# Patient Record
Sex: Female | Born: 1935 | Race: Black or African American | Hispanic: No | Marital: Single | Smoking: Never smoker
Health system: Southern US, Community
[De-identification: ages and names within clinical notes are randomized; demographics above are authoritative.]

## PROBLEM LIST (undated history)

## (undated) DIAGNOSIS — N189 Chronic kidney disease, unspecified: Secondary | ICD-10-CM

## (undated) HISTORY — PX: CHOLECYSTECTOMY: SHX55

---

## 2004-02-03 ENCOUNTER — Ambulatory Visit: Payer: Self-pay | Admitting: Internal Medicine

## 2010-06-13 ENCOUNTER — Ambulatory Visit: Payer: Self-pay | Admitting: Internal Medicine

## 2012-04-26 IMAGING — CR DG CHEST 2V
1 series · 3 of 3 positions shown · non-contrast
Comparison: none

REASON FOR EXAM: Shortness of breath
COMMENTS:

PROCEDURE:     DXR - DXR CHEST PA (OR AP) AND LATERAL  - June 13, 2010 [DATE]
RESULT:     The heart appears to be borderline enlarged. There is mild
hyperinflation. The interstitial markings are slightly prominent. No
significant pleural effusion is evident.

[Series 1: view not recorded · 0.17mm/px · 3 of 3 slices shown]
[im 1/3]
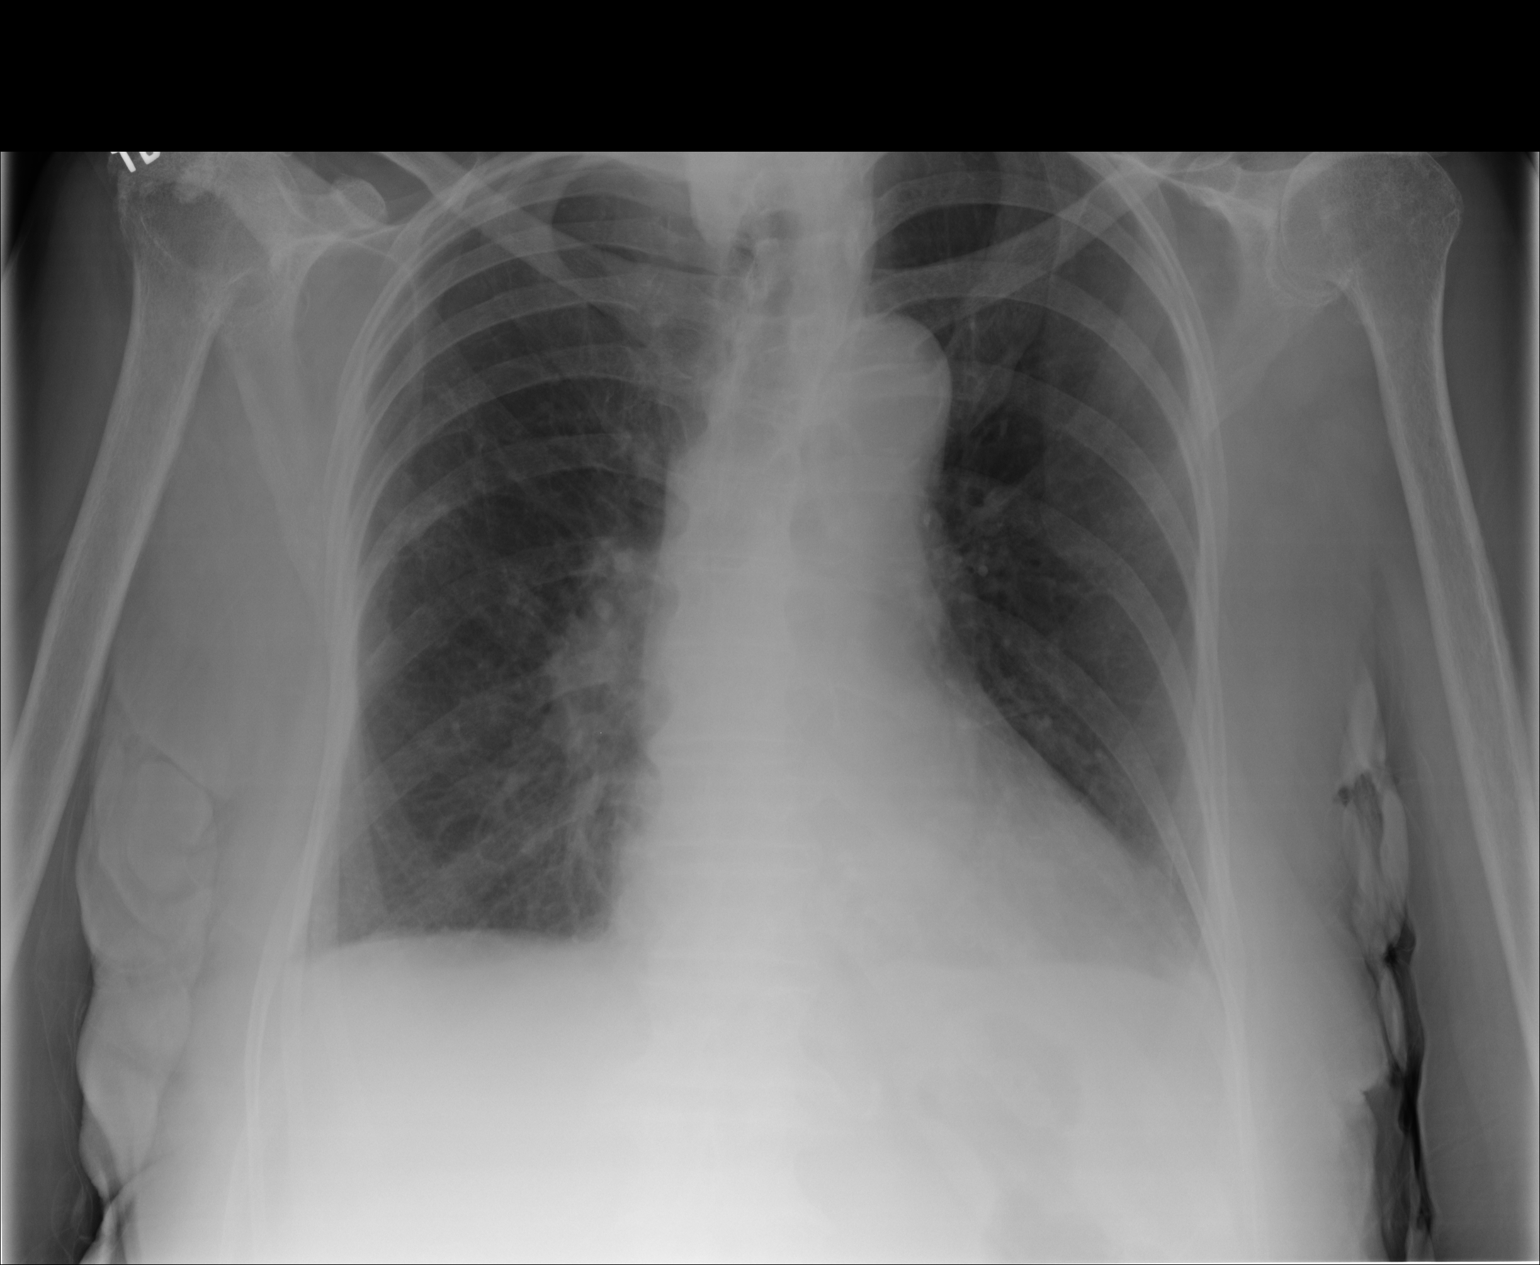
[im 2/3]
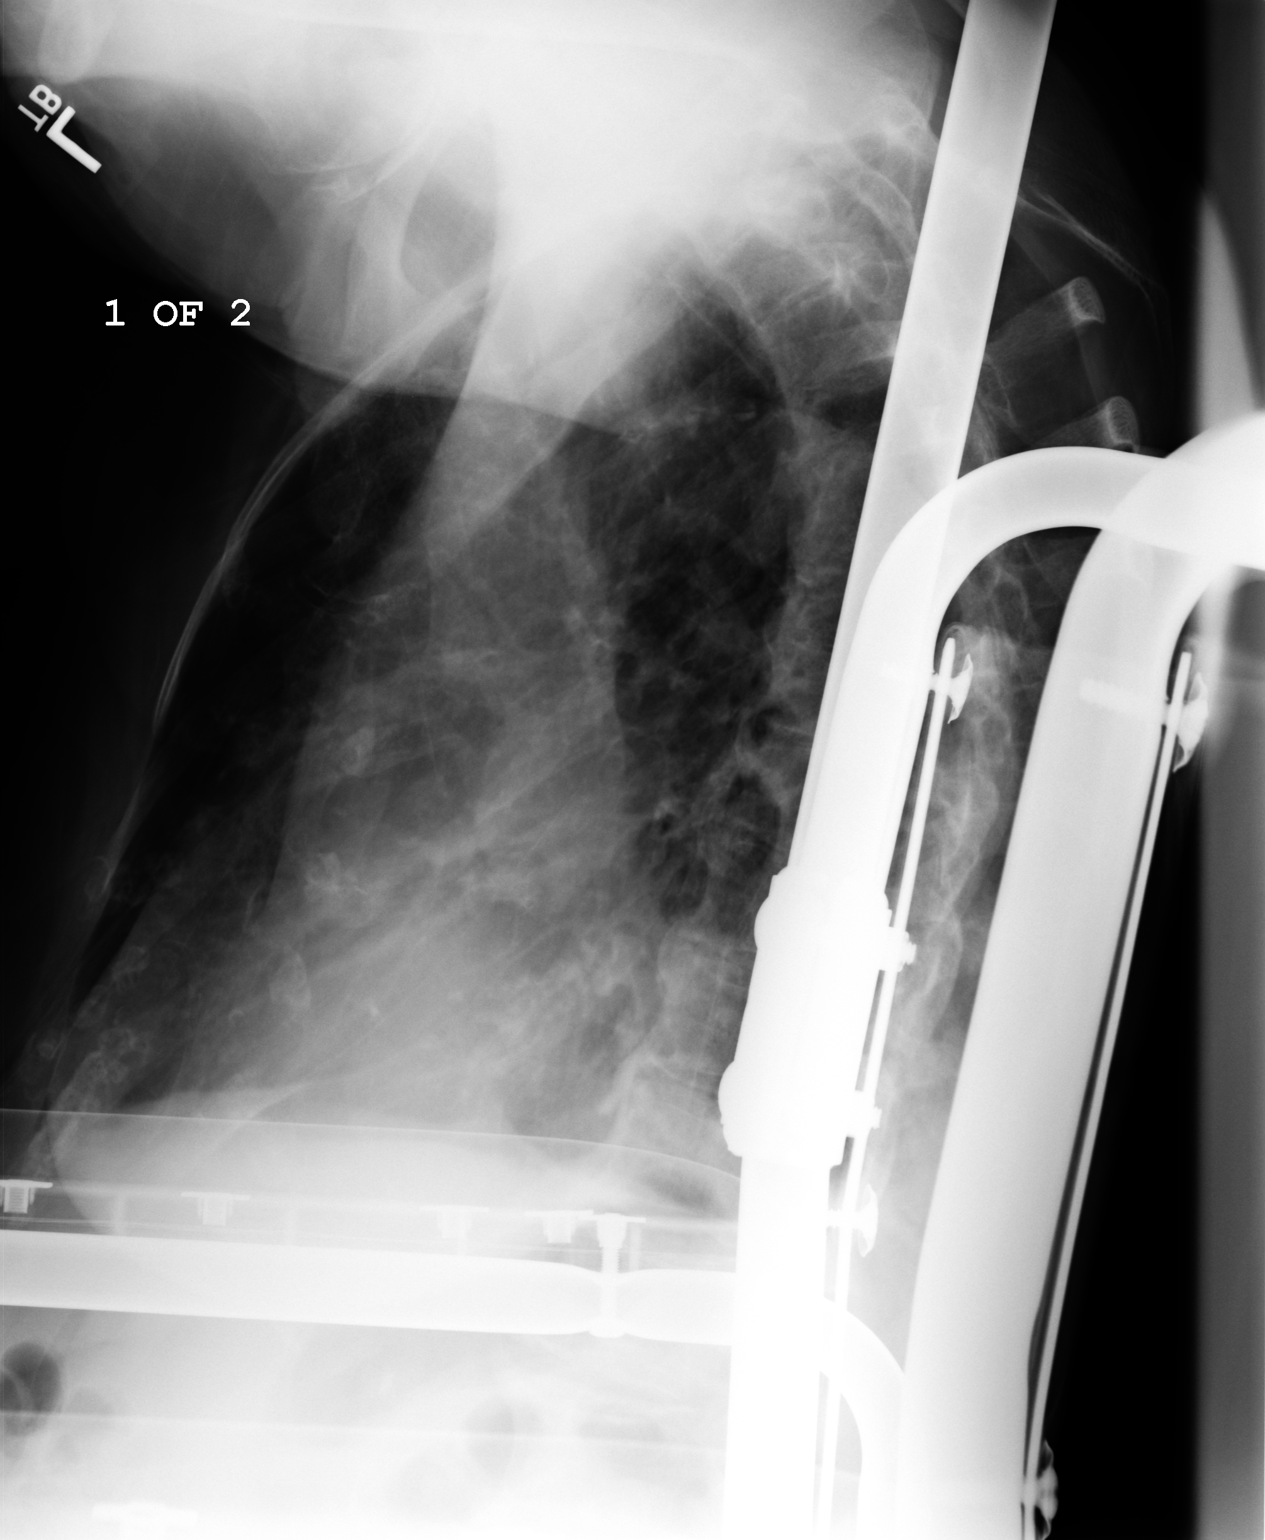
[im 3/3]
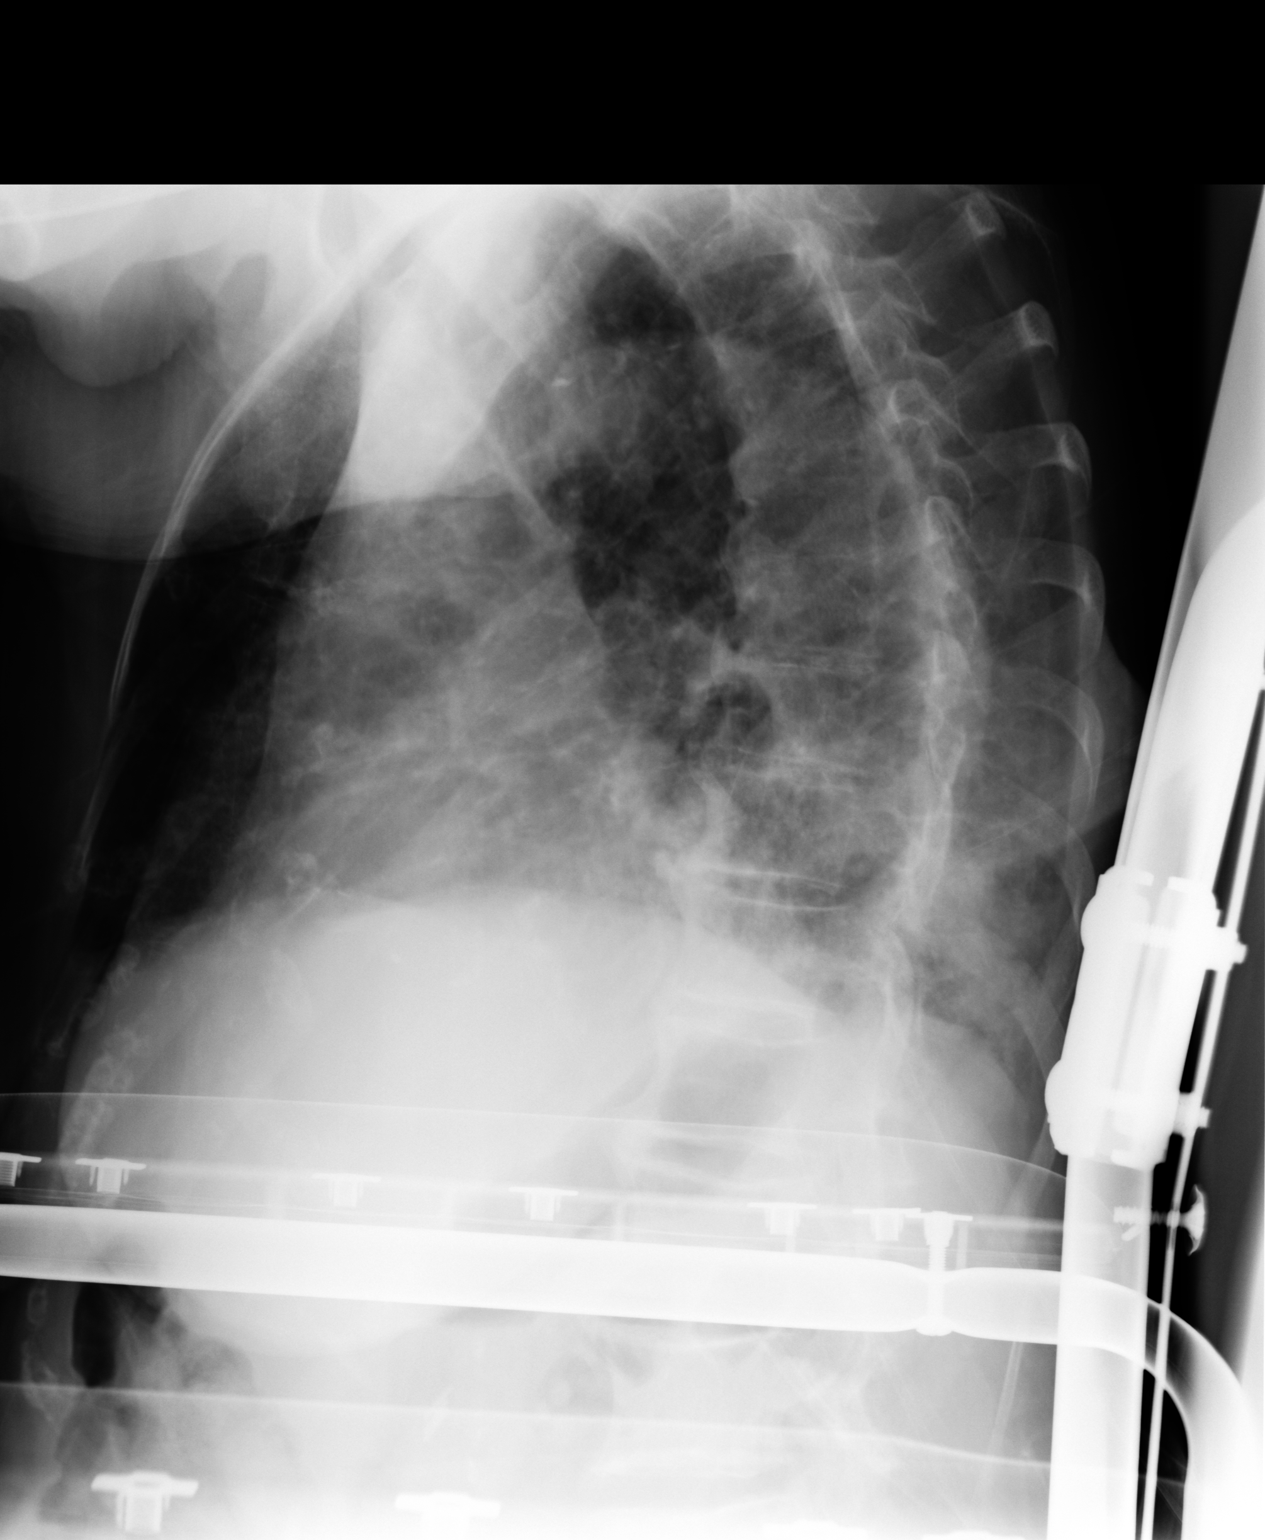

[3 of 3 positions shown; findings below may reference images not displayed]

IMPRESSION: COPD with borderline to mild cardiomegaly and either
fibrotic changes or mild interstitial edema. No focal consolidation or
definite pneumonia. Atherosclerotic disease is noted.

## 2013-01-23 ENCOUNTER — Emergency Department: Payer: Self-pay | Admitting: Emergency Medicine

## 2014-01-31 ENCOUNTER — Encounter: Payer: Self-pay | Admitting: Surgery

## 2014-02-14 ENCOUNTER — Encounter: Payer: Self-pay | Admitting: Surgery

## 2014-02-28 ENCOUNTER — Encounter: Payer: Self-pay | Admitting: Surgery

## 2014-03-29 ENCOUNTER — Encounter
Admit: 2014-03-29 | Disposition: A | Discharge status: Home / Self Care | Payer: Self-pay | Attending: Surgery | Admitting: Surgery

## 2014-04-29 ENCOUNTER — Encounter
Admit: 2014-04-29 | Disposition: A | Discharge status: Home / Self Care | Payer: Self-pay | Attending: Surgery | Admitting: Surgery

## 2014-05-30 ENCOUNTER — Encounter: Payer: Medicare Other | Attending: Surgery | Admitting: Surgery

## 2014-05-30 DIAGNOSIS — L89623 Pressure ulcer of left heel, stage 3: Secondary | ICD-10-CM | POA: Insufficient documentation

## 2014-05-30 DIAGNOSIS — L89312 Pressure ulcer of right buttock, stage 2: Secondary | ICD-10-CM | POA: Insufficient documentation

## 2014-05-31 NOTE — Progress Notes (Signed)
JETT, KULZER ([id number]) Visit Report for 05/30/2014 Chief Complaint Document Details Patient Name: Tracy Le, Tracy Le Date of Service: 05/30/2014 3:45 PM Medical Record Number: [id number] Patient Account Number: [id number] Date of Birth/Sex: 03/14/1912 (81 y.o. 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 She has no fresh complaints. she does not walk around much except transferring from a chair to the bed. Electronic Signature(s) Signed: 05/30/2014 4:43:42 PM By: Evlyn Kanner MD, FACS Entered By: Evlyn Kanner on 05/30/2014 16:22:54 AMAR, KEENUM ([id number]) -------------------------------------------------------------------------------- Debridement Details Patient Name: Tracy Le Date of Service: 05/30/2014 3:45 PM Medical Record Number: [id number] Patient Account Number: [id number] Date of Birth/Sex: 03/14/1912 (81 y.o. Female) Treating RN: Curtis Sites Primary Care Physician: Corky Downs Other Clinician: Referring Physician: Corky Downs Treating Physician/Extender: Rudene Re in Treatment: 17 Debridement Performed for Wound #1 Left Calcaneous Assessment: Performed By: Physician Tristan Schroeder., MD Debridement: Open Wound/Selective Debridement Selective Description: Pre-procedure Yes Verification/Time Out Taken: Start Time: 03:15 Pain Control: Lidocaine 5% topical ointment Level: Non-Viable Tissue Total Area Debrided (L x 0.5 (cm) x 0.7 (cm) = 0.35 (cm) W): Tissue and other Non-Viable, Eschar, Exudate, Fibrin/Slough material debrided: Instrument: Forceps Bleeding: None End Time: 03:18 Procedural Pain: 0 Post Procedural Pain: 0 Response to Treatment:  Procedure was tolerated well Post Debridement Measurements of Total Wound Length: (cm) 0.5 Stage: Category/Stage III Width: (cm) 0.7 Depth: (cm) 0.3 Volume: (cm) 0.082 Electronic Signature(s) Signed: 05/30/2014 5:02:48 PM By: Curtis Sites Signed: 05/31/2014 1:44:13 PM By: Evlyn Kanner MD, FACS Previous Signature: 05/30/2014 4:43:42 PM Version By: Evlyn Kanner MD, FACS Entered By: Curtis Sites on 05/30/2014 17:02:48 Tracy Le ([id number]) -------------------------------------------------------------------------------- HPI Details Patient Name: Tracy Le Date of Service: 05/30/2014 3:45 PM Medical Record Number: [id number] Patient Account Number: [id number] Date of Birth/Sex: 03/14/1912 (81 y.o. Female) Treating RN: Huel Coventry Primary Care Physician: Corky Downs Other Clinician: Referring Physician: Corky Downs Treating Physician/Extender: Rudene Re in Treatment: 17 History of Present Illness Location: left calcaneus Quality: Patient reports No Pain. Severity: Patient states wound (s) are getting better. Duration: Patient states that they are not certain how long the wound has been present. Timing: nil Context: The wound appeared gradually over time Modifying Factors: sitting most of the day Associated Signs and Symptoms: Patient reports having difficulty standing for long periods. HPI Description: The patient is an 81 year old female with a treatment history of a stage III left calcaneal wound. The patient is mostly bedbound at home. She does spend some time sitting up in a chair. She does not have an air mattress at home. Her caretaker has been using triple antibiotic on the bone itself. She denies any fevers at home. The caretaker denies any history of diabetes or vascular disease. Since being seen here here in the wound clinic she has been applying Santyl to the ulcer base. the patient returns for follow-up today. They have been compliant with dressing  changes. 03/29/14 -- the dressings have been done on a daily basis according to plan and the daughter says that she is running out of Santyl ointment and would need a new prescription. 04/26/2014 patient's daughter-in-law is comfortable doing the dressing and after discussing with her it is okay if the home health stops going to do the dressing once a  week. 03/29/14 - she seems to be doing fine and has no fresh issues. The daughter has been doing her dressings on a regular basis and has been doing them according to the plan. 05/09/2014 the daughter noticed a small ulcer on the right buttock medially which is tiny and a stage II pressure ulcer. Electronic Signature(s) Signed: 05/30/2014 4:43:42 PM By: Evlyn Kanner MD, FACS Entered By: Evlyn Kanner on 05/30/2014 16:23:01 TERRELL, SHIMKO ([id number]) -------------------------------------------------------------------------------- Physical Exam Details Patient Name: Tracy Le Date of Service: 05/30/2014 3:45 PM Medical Record Number: [id number] Patient Account Number: [id number] Date of Birth/Sex: 03/14/1912 (81 y.o. Female) Treating RN: Huel Coventry Primary Care Physician: Corky Downs Other Clinician: Referring Physician: Corky Downs Treating Physician/Extender: Rudene Re in Treatment: 17 Constitutional . Pulse regular. Respirations normal and unlabored. Afebrile. . Eyes Nonicteric. Reactive to light. Ears, Nose, Mouth, and Throat Lips, teeth, and gums WNL.Marland Kitchen Moist mucosa without lesions . Neck supple and nontender. No palpable supraclavicular or cervical adenopathy. Normal sized without goiter. Respiratory WNL. No retractions.. Cardiovascular Pedal Pulses WNL. minimal pedal edema on the left lower extremity.. Integumentary (Hair, Skin) the ulcer base is clean but there is minimal amount of undermining.Marland Kitchen Psychiatric Judgement and insight Intact.. No evidence of depression, anxiety, or agitation.. Electronic  Signature(s) Signed: 05/30/2014 4:43:42 PM By: Evlyn Kanner MD, FACS Entered By: Evlyn Kanner on 05/30/2014 16:23:49 MERINA, BEHRENDT ([id number]) -------------------------------------------------------------------------------- Physician Orders Details Patient Name: Tracy Le Date of Service: 05/30/2014 3:45 PM Medical Record Number: [id number] Patient Account Number: [id number] Date of Birth/Sex: 03/14/1912 (81 y.o. Female) Treating RN: Curtis Sites Primary Care Physician: Corky Downs Other Clinician: Referring Physician: Corky Downs Treating Physician/Extender: Rudene Re in Treatment: 5 Verbal / Phone Orders: Yes Clinician: Curtis Sites Read Back and Verified: Yes Diagnosis Coding Wound Cleansing Wound #1 Left Calcaneous o Clean wound with Normal Saline. Anesthetic Wound #1 Left Calcaneous o Topical Lidocaine 4% cream applied to wound bed prior to debridement Primary Wound Dressing Wound #1 Left Calcaneous o Iodoform packing Gauze Secondary Dressing Wound #1 Left Calcaneous o ABD and Kerlix/Conform Dressing Change Frequency Wound #1 Left Calcaneous o Change dressing every other day. Follow-up Appointments Wound #1 Left Calcaneous o Return Appointment in 1 week. Edema Control Wound #1 Left Calcaneous o Tubigrip Off-Loading Wound #1 Left Calcaneous o Turn and reposition every 2 hours Additional Orders / Instructions Wound #1 Left Calcaneous Dettmer, Kecia ([id number]) o Increase protein intake. o Activity as tolerated o Other: - sage boots Electronic Signature(s) Signed: 05/30/2014 4:43:42 PM By: Evlyn Kanner MD, FACS Signed: 05/30/2014 5:25:40 PM By: Curtis Sites Entered By: Curtis Sites on 05/30/2014 16:19:16 CARLEE, VONDERHAAR ([id number]) -------------------------------------------------------------------------------- Problem List Details Patient Name: Tracy Le Date of Service: 05/30/2014 3:45 PM Medical Record Number:  [id number] Patient Account Number: [id number] Date of Birth/Sex: 03/14/1912 (81 y.o. Female) Treating RN: Huel Coventry Primary Care Physician: Corky Downs Other Clinician: Referring Physician: Corky Downs Treating Physician/Extender: Rudene Re in Treatment: 17 Active Problems ICD-10 Encounter Code Description Active Date Diagnosis L89.623 Pressure ulcer of left heel, stage 3 01/31/2014 Yes L89.312 Pressure ulcer of right buttock, stage 2 05/09/2014 Yes Inactive Problems Resolved Problems Electronic Signature(s) Signed: 05/30/2014 4:43:42 PM By: Evlyn Kanner MD, FACS Entered By: Evlyn Kanner on 05/30/2014 16:21:15 Tracy Le ([id number]) -------------------------------------------------------------------------------- Progress Note Details Patient Name: Tracy Le Date of Service: 05/30/2014 3:45 PM Medical Record Number: [id number] Patient Account Number: [id number] Date of Birth/Sex: 03/14/1912 (81 y.o. 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 She has no fresh complaints. she does not walk around much except transferring from a chair to the bed. History of Present Illness (HPI) The following HPI elements were documented for the patient's wound: Location: left calcaneus Quality: Patient reports No Pain. Severity: Patient states wound (s) are getting better. Duration: Patient states that they are not certain how long the wound has been present. Timing: nil Context: The wound appeared gradually over time Modifying Factors: sitting most of the day Associated Signs and Symptoms: Patient reports having difficulty standing for long periods. The  patient is an 81 year old female with a treatment history of a stage III left calcaneal wound. The patient is mostly bedbound at home. She does spend some time sitting up in a chair. She does not have an air mattress at home. Her caretaker has been using triple antibiotic on the bone itself. She denies any fevers at home. The caretaker denies any history of diabetes or vascular disease. Since being seen here here in the wound clinic she has been applying Santyl to the ulcer base. the patient returns for follow-up today. They have been compliant with dressing changes. 03/29/14 -- the dressings have been done on a daily basis according to plan and the daughter says that she is running out of Santyl ointment and would need a new prescription. 04/26/2014 patient's daughter-in-law is comfortable doing the dressing and after discussing with her it is okay if the home health stops going to do the dressing once a week. 03/29/14 - she seems to be doing fine and has no fresh issues. The daughter has been doing her dressings on a regular basis and has been doing them according to the plan. 05/09/2014 the daughter noticed a small ulcer on the right buttock medially which is tiny and a stage II pressure ulcer. VERLEAN, ALLPORT ([id number]) Objective Constitutional Pulse regular. Respirations normal and unlabored. Afebrile. Vitals Time Taken: 3:57 PM, Height: 69 in, Weight: 189 lbs, BMI: 27.9, Temperature: 98.2 F, Pulse: 79 bpm, Respiratory Rate: 18 breaths/min, Blood Pressure: 103/58 mmHg. Eyes Nonicteric. Reactive to light. Ears, Nose, Mouth, and Throat Lips, teeth, and gums WNL.Marland Kitchen Moist mucosa without lesions . Neck supple and nontender. No palpable supraclavicular or cervical adenopathy. Normal sized without goiter. Respiratory WNL. No retractions.. Cardiovascular Pedal Pulses WNL. minimal pedal edema on the left lower extremity.Marland Kitchen Psychiatric Judgement and insight Intact.. No evidence of depression,  anxiety, or agitation.. Integumentary (Hair, Skin) the ulcer base is clean but there is minimal amount of undermining.. Wound #1 status is Open. Original cause of wound was Pressure Injury. The wound is located on the Left Calcaneous. The wound measures 0.5cm length x 0.7cm width x 0.3cm depth; 0.275cm^2 area and 0.082cm^3 volume. The wound is limited to skin breakdown. There is no tunneling or undermining noted. There is a small amount of serous drainage noted. The wound margin is distinct with the outline attached to the wound base. There is large (67-100%) red, pink granulation within the wound bed. There is a small (1-33%) amount of necrotic tissue within the wound bed including Adherent Slough. The periwound skin appearance exhibited: Dry/Scaly. The periwound skin appearance did not exhibit: Callus, Crepitus, Excoriation, Fluctuance, Friable, Induration, Localized Edema, Rash, Scarring, Maceration, Moist, Atrophie Blanche, Cyanosis, Ecchymosis, Hemosiderin Staining, Mottled, Pallor, Rubor,  Erythema. Periwound temperature was noted as No Abnormality. Tracy OsierLLOYD, Ritaj ([id number]) Assessment Active Problems ICD-10 [phone] - Pressure ulcer of left heel, stage 3 L89.312 - Pressure ulcer of right buttock, stage 2 after cleaning the wound nicely there is a minimal amount of undermining and hence we will change over to a 1/4 inch iodoform gauze packing. We'll continue to use offloading with heel protectors and constant changing of position. Procedures Wound #1 Wound #1 is a Pressure Ulcer located on the Left Calcaneous . There was a Non-Viable Tissue Open Wound/Selective [phone]([id number]) debridement with total area of 0.35 sq cm performed by Eri Platten, Ignacia FellingErrol J., MD. with the following instrument(s): Forceps to remove Non-Viable tissue/material including Exudate, Fibrin/Slough, and Eschar after achieving pain control using Lidocaine 5% topical ointment. A time out was conducted prior to the start of the  procedure. There was no bleeding. The procedure was tolerated well with a pain level of 0 throughout and a pain level of 0 following the procedure. Post Debridement Measurements: 0.5cm length x 0.7cm width x 0.3cm depth; 0.082cm^3 volume. Post debridement Stage noted as Category/Stage III. Plan Wound Cleansing: Wound #1 Left Calcaneous: Clean wound with Normal Saline. Anesthetic: Wound #1 Left Calcaneous: Topical Lidocaine 4% cream applied to wound bed prior to debridement Primary Wound Dressing: Wound #1 Left Calcaneous: Iodoform packing Gauze Secondary Dressing: Wound #1 Left Calcaneous: ABD and Kerlix/Conform Hollin, Nikkita ([id number]) Dressing Change Frequency: Wound #1 Left Calcaneous: Change dressing every other day. Follow-up Appointments: Wound #1 Left Calcaneous: Return Appointment in 1 week. Edema Control: Wound #1 Left Calcaneous: Tubigrip Off-Loading: Wound #1 Left Calcaneous: Turn and reposition every 2 hours Additional Orders / Instructions: Wound #1 Left Calcaneous: Increase protein intake. Activity as tolerated Other: - sage boots After cleaning the wound nicely there is a minimal amount of undermining and hence we will change over to a 1/4 inch iodoform gauze packing. We'll continue to use offloading with heel protectors and constant changing of position. We will see her back next week. Electronic Signature(s) Signed: 05/31/2014 1:44:13 PM By: Evlyn KannerBritto, Elleah Hemsley MD, FACS Previous Signature: 05/30/2014 4:43:42 PM Version By: Evlyn KannerBritto, Keltie Labell MD, FACS Entered By: Evlyn KannerBritto, Nevaeh Korte on 05/31/2014 13:42:54

## 2014-06-06 ENCOUNTER — Encounter: Payer: Medicare Other | Admitting: Surgery

## 2014-06-06 DIAGNOSIS — L89623 Pressure ulcer of left heel, stage 3: Secondary | ICD-10-CM | POA: Diagnosis not present

## 2014-06-07 NOTE — Progress Notes (Signed)
Tracy, Le ([id number]) Visit Report for 06/06/2014 Chief Complaint Document Details Patient Name: Tracy Le, Tracy Le Date of Service: 06/06/2014 3:45 PM Medical Record Number: [id number] Patient Account Number: [id number] Date of Birth/Sex: 1913/03/29 (81 y.o. 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 She has no fresh complaints. she does not walk around much except transferring from a chair to the bed. Electronic Signature(s) Signed: 06/06/2014 5:02:53 PM By: Evlyn Kanner MD, FACS Entered By: Evlyn Kanner on 06/06/2014 16:49:09 Tracy, Le ([id number]) -------------------------------------------------------------------------------- HPI Details Patient Name: Tracy Le Date of Service: 06/06/2014 3:45 PM Medical Record Number: [id number] Patient Account Number: [id number] Date of Birth/Sex: [birth year] (81 y.o. Female) Treating RN: Huel Coventry Primary Care Physician: Corky Downs Other Clinician: Referring Physician: Corky Downs Treating Physician/Extender: Rudene Re in Treatment: 18 History of Present Illness Location: left calcaneus Quality: Patient reports No Pain. Severity: Patient states wound (s) are getting better. Duration: Patient states that they are not certain how long the wound has been present. Timing: nil Context: The wound appeared gradually over time Modifying Factors: sitting most of the day Associated Signs and Symptoms: Patient reports having difficulty standing for long periods. HPI Description: The patient is an 81 year old female with a treatment history of a stage III left calcaneal wound. The patient is mostly bedbound at home. She does spend  some time sitting up in a chair. She does not have an air mattress at home. Her caretaker has been using triple antibiotic on the bone itself. She denies any fevers at home. The caretaker denies any history of diabetes or vascular disease. Since being seen here here in the wound clinic she has been applying Santyl to the ulcer base. the patient returns for follow-up today. They have been compliant with dressing changes. 03/29/14 -- the dressings have been done on a daily basis according to plan and the daughter says that she is running out of Santyl ointment and would need a new prescription. 04/26/2014 patient's daughter-in-law is comfortable doing the dressing and after discussing with her it is okay if the home health stops going to do the dressing once a week. 03/29/14 - she seems to be doing fine and has no fresh issues. The daughter has been doing her dressings on a regular basis and has been doing them according to the plan. 05/09/2014 the daughter noticed a small ulcer on the right buttock medially which is tiny and a stage II pressure ulcer. Electronic Signature(s) Signed: 06/06/2014 5:02:53 PM By: Evlyn Kanner MD, FACS Entered By: Evlyn Kanner on 06/06/2014 16:49:15 Tracy, Le ([id number]) -------------------------------------------------------------------------------- Physical Exam Details Patient Name: Tracy Le Date of Service: 06/06/2014 3:45 PM Medical Record Number: [id number] Patient Account Number: [id number] Date of Birth/Sex: 25-Sep-1913 (81 y.o. Female) Treating RN: Huel Coventry Primary Care Physician: Corky Downs Other Clinician: Referring Physician: Corky Downs Treating Physician/Extender: Rudene Re in Treatment: 18 Constitutional . Pulse regular. Respirations normal and unlabored. Afebrile. . Eyes Nonicteric. Reactive to light. Ears, Nose, Mouth, and Throat Lips, teeth, and gums WNL.Marland Kitchen Moist mucosa without lesions . Neck supple and nontender. No  palpable supraclavicular or cervical adenopathy. Normal sized without goiter. Respiratory WNL. No retractions.. Cardiovascular Pedal Pulses WNL. is good pedal edema both lower extremities. Integumentary (Hair, Skin) the left heel  has a undermined area where she has a pressure ulcer and this is clean.. No crepitus or fluctuance. No peri-wound warmth or erythema. No masses.Marland Kitchen. Psychiatric Judgement and insight Intact.. No evidence of depression, anxiety, or agitation.. Electronic Signature(s) Signed: 06/06/2014 5:02:53 PM By: Evlyn KannerBritto, Lynore Coscia MD, FACS Entered By: Evlyn KannerBritto, Catheleen Langhorne on 06/06/2014 16:50:48 Tracy Le, Tracy Le ([id number]) -------------------------------------------------------------------------------- Physician Orders Details Patient Name: Tracy Le, Tracy Le Date of Service: 06/06/2014 3:45 PM Medical Record Number: [id number] Patient Account Number: [id number] Date of Birth/Sex: Sep 06, 1913 (81 y.o. Female) Treating RN: Huel CoventryWoody, Kim Primary Care Physician: Corky DownsMASOUD, JAVED Other Clinician: Referring Physician: Corky DownsMASOUD, JAVED Treating Physician/Extender: Rudene ReBritto, Latricia Cerrito Weeks in Treatment: 4518 Verbal / Phone Orders: Yes Clinician: Huel CoventryWoody, Kim Read Back and Verified: Yes Diagnosis Coding Wound Cleansing Wound #1 Left Calcaneous o Clean wound with Normal Saline. Anesthetic Wound #1 Left Calcaneous o Topical Lidocaine 4% cream applied to wound bed prior to debridement Primary Wound Dressing Wound #1 Left Calcaneous o Iodoform packing Gauze Secondary Dressing Wound #1 Left Calcaneous o ABD and Kerlix/Conform Dressing Change Frequency Wound #1 Left Calcaneous o Change dressing every other day. Follow-up Appointments Wound #1 Left Calcaneous o Return Appointment in 1 week. Off-Loading Wound #1 Left Calcaneous o Turn and reposition every 2 hours Additional Orders / Instructions Wound #1 Left Calcaneous o Increase protein intake. o Activity as tolerated o Other: - sage  boots Tracy Le, Tracy Le ([id number]) Electronic Signature(s) Signed: 06/06/2014 5:02:53 PM By: Evlyn KannerBritto, Merri Dimaano MD, FACS Signed: 06/06/2014 5:45:58 PM By: Elliot GurneyWoody, RN, BSN, Kim RN, BSN Entered By: Elliot GurneyWoody, RN, BSN, Kim on 06/06/2014 16:46:30 Tracy Le, Haide ([id number]) -------------------------------------------------------------------------------- Problem List Details Patient Name: Tracy Le, Tracy Le Date of Service: 06/06/2014 3:45 PM Medical Record Number: [id number] Patient Account Number: [id number] Date of Birth/Sex: Sep 06, 1913 (81 y.o. Female) Treating RN: Huel CoventryWoody, Kim Primary Care Physician: Corky DownsMASOUD, JAVED Other Clinician: Referring Physician: Corky DownsMASOUD, JAVED Treating Physician/Extender: Rudene ReBritto, Sharone Picchi Weeks in Treatment: 18 Active Problems ICD-10 Encounter Code Description Active Date Diagnosis L89.623 Pressure ulcer of left heel, stage 3 01/31/2014 Yes L89.312 Pressure ulcer of right buttock, stage 2 05/09/2014 Yes Inactive Problems Resolved Problems Electronic Signature(s) Signed: 06/06/2014 5:02:53 PM By: Evlyn KannerBritto, Alyss Granato MD, FACS Entered By: Evlyn KannerBritto, Savera Donson on 06/06/2014 16:48:51 Tracy Le, Mahrukh ([id number]) -------------------------------------------------------------------------------- Progress Note Details Patient Name: Tracy Le, Tracy Le Date of Service: 06/06/2014 3:45 PM Medical Record Number: [id number] Patient Account Number: [id number] Date of Birth/Sex: Sep 06, 1913 (81 y.o. 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 She has no fresh complaints. she does not walk around much except transferring from a chair to the bed. History of Present Illness (HPI) The following HPI elements were  documented for the patient's wound: Location: left calcaneus Quality: Patient reports No Pain. Severity: Patient states wound (s) are getting better. Duration: Patient states that they are not certain how long the wound has been present. Timing: nil Context: The wound appeared gradually over time Modifying Factors: sitting most of the day Associated Signs and Symptoms: Patient reports having difficulty standing for long periods. The patient is an 81 year old female with a treatment history of a stage III left calcaneal wound. The patient is mostly bedbound at home. She does spend some time sitting up in a chair. She does not have an air mattress at home. Her caretaker has been  using triple antibiotic on the bone itself. She denies any fevers at home. The caretaker denies any history of diabetes or vascular disease. Since being seen here here in the wound clinic she has been applying Santyl to the ulcer base. the patient returns for follow-up today. They have been compliant with dressing changes. 03/29/14 -- the dressings have been done on a daily basis according to plan and the daughter says that she is running out of Santyl ointment and would need a new prescription. 04/26/2014 patient's daughter-in-law is comfortable doing the dressing and after discussing with her it is okay if the home health stops going to do the dressing once a week. 03/29/14 - she seems to be doing fine and has no fresh issues. The daughter has been doing her dressings on a regular basis and has been doing them according to the plan. 05/09/2014 the daughter noticed a small ulcer on the right buttock medially which is tiny and a stage II pressure ulcer. Tracy Le, Tracy Le ([id number]) Objective Constitutional Pulse regular. Respirations normal and unlabored. Afebrile. Vitals Time Taken: 4:16 PM, Height: 69 in, Weight: 189 lbs, BMI: 27.9, Temperature: 98.3 F, Pulse: 70 bpm, Respiratory Rate: 16 breaths/min, Blood Pressure:  101/60 mmHg. Eyes Nonicteric. Reactive to light. Ears, Nose, Mouth, and Throat Lips, teeth, and gums WNL.Marland Kitchen Moist mucosa without lesions . Neck supple and nontender. No palpable supraclavicular or cervical adenopathy. Normal sized without goiter. Respiratory WNL. No retractions.. Cardiovascular Pedal Pulses WNL. is good pedal edema both lower extremities. Psychiatric Judgement and insight Intact.. No evidence of depression, anxiety, or agitation.. Integumentary (Hair, Skin) the left heel has a undermined area where she has a pressure ulcer and this is clean.. No crepitus or fluctuance. No peri-wound warmth or erythema. No masses.. Wound #1 status is Open. Original cause of wound was Pressure Injury. The wound is located on the Left Calcaneous. The wound measures 0.4cm length x 0.6cm width x 0.3cm depth; 0.188cm^2 area and 0.057cm^3 volume. The wound is limited to skin breakdown. There is a small amount of serous drainage noted. The wound margin is distinct with the outline attached to the wound base. There is large (67-100%) red, pink granulation within the wound bed. There is a small (1-33%) amount of necrotic tissue within the wound bed including Adherent Slough. The periwound skin appearance exhibited: Moist. The periwound skin appearance did not exhibit: Callus, Crepitus, Excoriation, Fluctuance, Friable, Induration, Localized Edema, Rash, Scarring, Dry/Scaly, Maceration, Atrophie Blanche, Cyanosis, Ecchymosis, Hemosiderin Staining, Mottled, Pallor, Rubor, Erythema. Periwound temperature was noted as No Abnormality. Tracy Le, Tracy Le ([id number]) Assessment Active Problems ICD-10 [id number] - Pressure ulcer of left heel, stage 3 L89.312 - Pressure ulcer of right buttock, stage 2 we will continue with local care with iodoform Iodoform Gauze packing and offloading. Plan Wound Cleansing: Wound #1 Left Calcaneous: Clean wound with Normal Saline. Anesthetic: Wound #1 Left  Calcaneous: Topical Lidocaine 4% cream applied to wound bed prior to debridement Primary Wound Dressing: Wound #1 Left Calcaneous: Iodoform packing Gauze Secondary Dressing: Wound #1 Left Calcaneous: ABD and Kerlix/Conform Dressing Change Frequency: Wound #1 Left Calcaneous: Change dressing every other day. Follow-up Appointments: Wound #1 Left Calcaneous: Return Appointment in 1 week. Off-Loading: Wound #1 Left Calcaneous: Turn and reposition every 2 hours Additional Orders / Instructions: Wound #1 Left Calcaneous: Increase protein intake. Activity as tolerated Other: - sage boots Tracy Le, Tracy Le ([id number]) We will continue with local care with iodoform Iodoform Gauze packing and offloading. Her daughter-in-law has been doing her dressings and we have discussed  wound care with her and see her back next week. Electronic Signature(s) Signed: 06/06/2014 5:02:53 PM By: Evlyn KannerBritto, Thinh Cuccaro MD, FACS Entered By: Evlyn KannerBritto, Yehoshua Vitelli on 06/06/2014 16:52:36 Tracy Le, Tracy Le ([id number]) -------------------------------------------------------------------------------- SuperBill Details Patient Name: Tracy Le, Tracy Le Date of Service: 06/06/2014 Medical Record Number: [id number] Patient Account Number: [id number] Date of Birth/Sex: 07-31-1913 (81 y.o. Female) Treating RN: Huel CoventryWoody, Kim Primary Care Physician: Corky DownsMASOUD, JAVED Other Clinician: Referring Physician: Corky DownsMASOUD, JAVED Treating Physician/Extender: Rudene ReBritto, Pricella Gaugh Weeks in Treatment: 18 Diagnosis Coding ICD-10 Codes Code Description [phone] Pressure ulcer of left heel, stage 3 L89.312 Pressure ulcer of right buttock, stage 2 Facility Procedures CPT4 Code: [id number] Description: [id number] - WOUND CARE VISIT-LEV 2 EST PT Modifier: Quantity: 1 Physician Procedures CPT4 Code: [id number] Description: 99213 - WC PHYS LEVEL 3 - EST PT ICD-10 Description Diagnosis L89.623 Pressure ulcer of left heel, stage 3 Modifier: Quantity: 1 Electronic Signature(s) Signed: 06/06/2014  5:45:58 PM By: Elliot GurneyWoody, RN, BSN, Kim RN, BSN Previous Signature: 06/06/2014 5:02:53 PM Version By: Evlyn KannerBritto, Andrae Claunch MD, FACS Entered By: Elliot GurneyWoody, RN, BSN, Kim on 06/06/2014 17:41:47

## 2014-06-07 NOTE — Progress Notes (Signed)
Tracy Le, Avi ([id number]) Visit Report for 06/06/2014 Arrival Information Details Patient Name: Tracy Le, Tracy Le Date of Service: 06/06/2014 3:45 PM Medical Record Number: [id number] Patient Account Number: [id number] Date of Birth/Sex: 03-19-1913 (81 y.o. Female) Treating RN: Huel CoventryWoody, Kim Primary Care Physician: Corky DownsMASOUD, JAVED Other Clinician: Referring Physician: Corky DownsMASOUD, JAVED Treating Physician/Extender: Rudene ReBritto, Errol Weeks in Treatment: 18 Visit Information History Since Last Visit Added or deleted any medications: No Patient Arrived: Wheel Chair Any new allergies or adverse No Arrival Time: 16:14 reactions: Accompanied By: daughter in Had a fall or experienced change in No law activities of daily living that may Transfer Assistance: Manual affect Patient Identification Verified: Yes risk of falls: Secondary Verification Process Yes Signs or symptoms of abuse/neglect No Completed: since last visito Patient Has Alerts: Yes Hospitalized since last visit: No Patient Alerts: 1/16 ABI L: Has Dressing in Place as Yes 1.23 Prescribed: Pain Present Now: Unable to [employer] Electronic Signature(s) Signed: 06/06/2014 5:45:58 PM By: Elliot GurneyWoody, RN, BSN, Kim RN, BSN Entered By: Elliot GurneyWoody, RN, BSN, Kim on 06/06/2014 16:15:22 Tracy Le, Tracy Le ([id number]) -------------------------------------------------------------------------------- Clinic Level of Care Assessment Details Patient Name: Tracy Le, Tracy Le Date of Service: 06/06/2014 3:45 PM Medical Record Number: [id number] Patient Account Number: [id number] Date of Birth/Sex: 03-19-1913 (81 y.o. Female) Treating RN: Huel CoventryWoody, Kim Primary Care Physician: Corky DownsMASOUD, JAVED Other Clinician: Referring Physician: Corky DownsMASOUD, JAVED Treating Physician/Extender: Rudene ReBritto, Errol Weeks in Treatment: 18 Clinic Level of Care Assessment Items TOOL 4 Quantity Score []  - Use when only an EandM is performed on FOLLOW-UP visit 0 ASSESSMENTS - Nursing Assessment / Reassessment []  -  Reassessment of Co-morbidities (includes updates in patient status) 0 X - Reassessment of Adherence to Treatment Plan 1 5 ASSESSMENTS - Wound and Skin Assessment / Reassessment []  - Simple Wound Assessment / Reassessment - one wound 0 []  - Complex Wound Assessment / Reassessment - multiple wounds 0 []  - Dermatologic / Skin Assessment (not related to wound area) 0 ASSESSMENTS - Focused Assessment []  - Circumferential Edema Measurements - multi extremities 0 []  - Nutritional Assessment / Counseling / Intervention 0 []  - Lower Extremity Assessment (monofilament, tuning fork, pulses) 0 []  - Peripheral Arterial Disease Assessment (using hand held doppler) 0 ASSESSMENTS - Ostomy and/or Continence Assessment and Care []  - Incontinence Assessment and Management 0 []  - Ostomy Care Assessment and Management (repouching, etc.) 0 PROCESS - Coordination of Care X - Simple Patient / Family Education for ongoing care 1 15 []  - Complex (extensive) Patient / Family Education for ongoing care 0 X - Staff obtains ChiropractorConsents, Records, Test Results / Process Orders 1 10 []  - Staff telephones HHA, Nursing Homes / Clarify orders / etc 0 []  - Routine Transfer to another Facility (non-emergent condition) 0 Needles, Lannette ([id number]) []  - Routine Hospital Admission (non-emergent condition) 0 []  - New Admissions / Manufacturing engine[employer]surance Authorizations / Ordering NPWT, Apligraf, etc. 0 []  - Emergency Hospital Admission (emergent condition) 0 X - Simple Discharge Coordination 1 10 []  - Complex (extensive) Discharge Coordination 0 PROCESS - Special Needs []  - Pediatric / Minor Patient Management 0 []  - Isolation Patient Management 0 []  - Hearing / Language / Visual special needs 0 []  - Assessment of Community assistance (transportation, D/C planning, etc.) 0 []  - Additional assistance / Altered mentation 0 []  - Support Surface(s) Assessment (bed, cushion, seat, etc.) 0 INTERVENTIONS - Wound Cleansing / Measurement X - Simple  Wound Cleansing - one wound 1 5 []  - Complex Wound Cleansing - multiple wounds 0 X - Wound Imaging (photographs - any number of  wounds) 1 5  - Wound Tracing (instead of photographs) 0 X - Simple Wound Measurement - one wound 1 5  - Complex Wound Measurement - multiple wounds 0 INTERVENTIONS - Wound Dressings X - Small Wound Dressing one or multiple wounds 1 10  - Medium Wound Dressing one or multiple wounds 0  - Large Wound Dressing one or multiple wounds 0  - Application of Medications - topical 0  - Application of Medications - injection 0 INTERVENTIONS - Miscellaneous  - External ear exam 0 Chiusano, Puja ([id number])  - Specimen Collection (cultures, biopsies, blood, body fluids, etc.) 0  - Specimen(s) / Culture(s) sent or taken to Lab for analysis 0  - Patient Transfer (multiple staff / Michiel Sites Lift / Similar devices) 0  - Simple Staple / Suture removal (25 or less) 0  - Complex Staple / Suture removal (26 or more) 0  - Hypo / Hyperglycemic Management (close monitor of Blood Glucose) 0  - Ankle / Brachial Index (ABI) - do not check if billed separately 0 X - Vital Signs 1 5 Has the patient been seen at the hospital within the last three years: Yes Total Score: 70 Level Of Care: New/Established - Level 2 Electronic Signature(s) Signed: 06/06/2014 5:45:58 PM By: Elliot Gurney, RN, BSN, Kim RN, BSN Entered By: Elliot Gurney, RN, BSN, Kim on 06/06/2014 16:47:03 Tracy Le ([id number]) -------------------------------------------------------------------------------- Encounter Discharge Information Details Patient Name: Tracy Le Date of Service: 06/06/2014 3:45 PM Medical Record Number: [id number] Patient Account Number: [id number] Date of Birth/Sex: July 31, 1913 (81 y.o. Female) Treating RN: Huel Coventry Primary Care Physician: Corky Downs Other Clinician: Referring Physician: Corky Downs Treating Physician/Extender: Rudene Re in Treatment: 18 Encounter Discharge  Information Items Schedule Follow-up Appointment: No Medication Reconciliation completed No and provided to Patient/Care Hilmer Aliberti: Provided on Clinical Summary of Care: 06/06/2014 Form Type Recipient Paper Patient LL Electronic Signature(s) Signed: 06/06/2014 4:56:18 PM By: Gwenlyn Perking Entered By: Gwenlyn Perking on 06/06/2014 16:56:17 Golla, Antwan ([id number]) -------------------------------------------------------------------------------- Lower Extremity Assessment Details Patient Name: Tracy Le Date of Service: 06/06/2014 3:45 PM Medical Record Number: [id number] Patient Account Number: [id number] Date of Birth/Sex: 02/07/1913 (81 y.o. Female) Treating RN: Huel Coventry Primary Care Physician: Corky Downs Other Clinician: Referring Physician: Corky Downs Treating Physician/Extender: Rudene Re in Treatment: 18 Vascular Assessment Pulses: Posterior Tibial Dorsalis Pedis Palpable: [Left:Yes] Extremity colors, hair growth, and conditions: Extremity Color: [Left:Normal] Hair Growth on Extremity: [Left:No] Temperature of Extremity: [Left:Cool] Capillary Refill: [Left:< 3 seconds] Toe Nail Assessment Left: Right: Thick: Yes Discolored: Yes Deformed: Yes Improper Length and Hygiene: No Electronic Signature(s) Signed: 06/06/2014 5:45:58 PM By: Elliot Gurney, RN, BSN, Kim RN, BSN Entered By: Elliot Gurney, RN, BSN, Kim on 06/06/2014 16:21:33 Tracy Le ([id number]) -------------------------------------------------------------------------------- Multi Wound Chart Details Patient Name: Tracy Le Date of Service: 06/06/2014 3:45 PM Medical Record Number: [id number] Patient Account Number: [id number] Date of Birth/Sex: September 14, 1913 (81 y.o. Female) Treating RN: Huel Coventry Primary Care Physician: Corky Downs Other Clinician: Referring Physician: Corky Downs Treating Physician/Extender: Rudene Re in Treatment: 18 Vital Signs Height(in): 69 Pulse(bpm): 70 Weight(lbs): 189  Blood Pressure 101/60 (mmHg): Body Mass Index(BMI): 28 Temperature(F): 98.3 Respiratory Rate 16 (breaths/min): Photos: [1:No Photos] [N/A:N/A] Wound Location: [1:Left Calcaneous] [N/A:N/A] Wounding Event: [1:Pressure Injury] [N/A:N/A] Primary Etiology: [1:Pressure Ulcer] [N/A:N/A] Comorbid History: [1:History of pressure wounds, Osteoarthritis, Dementia] [N/A:N/A] Date Acquired: [1:12/31/2013] [N/A:N/A] Weeks of Treatment: [1:18] [N/A:N/A] Wound Status: [1:Open] [N/A:N/A] Measurements L x W x D 0.4x0.6x0.3 [N/A:N/A] (cm) Area (cm) : [1:0.188] [N/A:N/A] Volume (cm) : [1:0.057] [N/A:N/A] %  Reduction in Area: [1:97.90%] [N/A:N/A] % Reduction in Volume: 93.50% [N/A:N/A] Classification: [1:Category/Stage III] [N/A:N/A] Exudate Amount: [1:Small] [N/A:N/A] Exudate Type: [1:Serous] [N/A:N/A] Exudate Color: [1:amber] [N/A:N/A] Wound Margin: [1:Distinct, outline attached] [N/A:N/A] Granulation Amount: [1:Large (67-100%)] [N/A:N/A] Granulation Quality: [1:Red, Pink] [N/A:N/A] Necrotic Amount: [1:Small (1-33%)] [N/A:N/A] Exposed Structures: [1:Fascia: No Fat: No Tendon: No Muscle: No Joint: No Bone: No] [N/A:N/A] Limited to Skin Breakdown Epithelialization: Small (1-33%) N/A N/A Periwound Skin Texture: Edema: No N/A N/A Excoriation: No Induration: No Callus: No Crepitus: No Fluctuance: No Friable: No Rash: No Scarring: No Periwound Skin Moist: Yes N/A N/A Moisture: Maceration: No Dry/Scaly: No Periwound Skin Color: Atrophie Blanche: No N/A N/A Cyanosis: No Ecchymosis: No Erythema: No Hemosiderin Staining: No Mottled: No Pallor: No Rubor: No Temperature: No Abnormality N/A N/A Tenderness on No N/A N/A Palpation: Wound Preparation: Ulcer Cleansing: N/A N/A Rinsed/Irrigated with Saline Topical Anesthetic Applied: Other: Lidocaine 4% Ointment Treatment Notes Electronic Signature(s) Signed: 06/06/2014 5:45:58 PM By: Elliot GurneyWoody, RN, BSN, Kim RN, BSN Entered By: Elliot GurneyWoody,  RN, BSN, Kim on 06/06/2014 16:45:45 Tracy Le, Tracy Le ([id number]) -------------------------------------------------------------------------------- Multi-Disciplinary Care Plan Details Patient Name: Tracy Le, [place] Date of Service: 06/06/2014 3:45 PM Medical Record Number: [id number] Patient Account Number: [id number] Date of Birth/Sex: 03-30-1913 (81 y.o. Female) Treating RN: Huel CoventryWoody, Kim Primary Care Physician: Corky DownsMASOUD, JAVED Other Clinician: Referring Physician: Corky DownsMASOUD, JAVED Treating Physician/Extender: Rudene ReBritto, Errol Weeks in Treatment: 4118 Active Inactive Abuse / Safety / Falls / Self Care Management Nursing Diagnoses: Potential for falls Goals: Patient will remain injury free Date Initiated: 01/31/2014 Goal Status: Active Interventions: Assess fall risk on admission and as needed Notes: Nutrition Nursing Diagnoses: Potential for alteratiion in Nutrition/Potential for imbalanced nutrition Goals: Patient/caregiver agrees to and verbalizes understanding of need to use nutritional supplements and/or vitamins as prescribed Date Initiated: 01/31/2014 Goal Status: Active Interventions: Assess patient nutrition upon admission and as needed per policy Notes: Pressure Nursing Diagnoses: Potential for impaired tissue integrity related to pressure, friction, moisture, and shear Goals: Patient will remain free from development of additional pressure ulcers Tracy Le, Tracy Le ([id number]) Date Initiated: 01/31/2014 Goal Status: Active Interventions: Assess potential for pressure ulcer upon admission and as needed Notes: Wound/Skin Impairment Nursing Diagnoses: Impaired tissue integrity Goals: Ulcer/skin breakdown will have a volume reduction of 30% by week 4 Date Initiated: 01/31/2014 Goal Status: Active Interventions: Assess ulceration(s) every visit Notes: Electronic Signature(s) Signed: 06/06/2014 5:45:58 PM By: Elliot GurneyWoody, RN, BSN, Kim RN, BSN Entered By: Elliot GurneyWoody, RN, BSN, Kim on 06/06/2014  16:45:35 Tracy Le, Micaela ([id number]) -------------------------------------------------------------------------------- Pain Assessment Details Patient Name: Tracy Le, Charnise Date of Service: 06/06/2014 3:45 PM Medical Record Number: [id number] Patient Account Number: [id number] Date of Birth/Sex: 03-30-1913 (81 y.o. Female) Treating RN: Huel CoventryWoody, Kim Primary Care Physician: Corky DownsMASOUD, JAVED Other Clinician: Referring Physician: Corky DownsMASOUD, JAVED Treating Physician/Extender: Rudene ReBritto, Errol Weeks in Treatment: 18 Active Problems Location of Pain Severity and Description of Pain Patient Has Paino No Site Locations Pain Management and Medication Current Pain Management: Electronic Signature(s) Signed: 06/06/2014 5:45:58 PM By: Elliot GurneyWoody, RN, BSN, Kim RN, BSN Entered By: Elliot GurneyWoody, RN, BSN, Kim on 06/06/2014 16:15:30 Tracy Le, Sesilia ([id number]) -------------------------------------------------------------------------------- Patient/Caregiver Education Details Patient Name: Tracy Le, Giulliana Date of Service: 06/06/2014 3:45 PM Medical Record Number: [id number] Patient Account Number: [id number] Date of Birth/Gender: 03-30-1913 (81 y.o. Female) Treating RN: Curtis Sitesorthy, Joanna Primary Care Physician: Corky DownsMASOUD, JAVED Other Clinician: Referring Physician: Corky DownsMASOUD, JAVED Treating Physician/Extender: Rudene ReBritto, Errol Weeks in Treatment: 3118 Education Assessment Education Provided To: Caregiver Education Topics Provided Pressure: Handouts: Preventing Pressure Ulcers Methods: Explain/Verbal Responses: State content correctly Electronic Signature(s)  Signed: 06/06/2014 5:33:42 PM By: Curtis Sites Entered By: Curtis Sites on 06/06/2014 17:33:42 Davalos, Thomasenia Sales ([id number]) -------------------------------------------------------------------------------- Wound Assessment Details Patient Name: Tracy Le Date of Service: 06/06/2014 3:45 PM Medical Record Number: [id number] Patient Account Number: [id number] Date of Birth/Sex: 06/06/1913 (81 y.o.  Female) Treating RN: Huel Coventry Primary Care Physician: Corky Downs Other Clinician: Referring Physician: Corky Downs Treating Physician/Extender: Rudene Re in Treatment: 18 Wound Status Wound Number: 1 Primary Pressure Ulcer Etiology: Wound Location: Left Calcaneous Wound Open Wounding Event: Pressure Injury Status: Date Acquired: 12/31/2013 Comorbid History of pressure wounds, Weeks Of Treatment: 18 History: Osteoarthritis, Dementia Clustered Wound: No Photos Photo Uploaded By: Elliot Gurney, RN, BSN, Kim on 06/06/2014 17:28:22 Wound Measurements Length: (cm) 0.4 Width: (cm) 0.6 Depth: (cm) 0.3 Area: (cm) 0.188 Volume: (cm) 0.057 % Reduction in Area: 97.9% % Reduction in Volume: 93.5% Epithelialization: Small (1-33%) Wound Description Classification: Category/Stage III Wound Margin: Distinct, outline attached Exudate Amount: Small Exudate Type: Serous Exudate Color: amber Foul Odor After Cleansing: No Wound Bed Granulation Amount: Large (67-100%) Exposed Structure Granulation Quality: Red, Pink Fascia Exposed: No Necrotic Amount: Small (1-33%) Fat Layer Exposed: No Necrotic Quality: Adherent Slough Tendon Exposed: No Haddon, Ofelia ([id number]) Muscle Exposed: No Joint Exposed: No Bone Exposed: No Limited to Skin Breakdown Periwound Skin Texture Texture Color No Abnormalities Noted: No No Abnormalities Noted: No Callus: No Atrophie Blanche: No Crepitus: No Cyanosis: No Excoriation: No Ecchymosis: No Fluctuance: No Erythema: No Friable: No Hemosiderin Staining: No Induration: No Mottled: No Localized Edema: No Pallor: No Rash: No Rubor: No Scarring: No Temperature / Pain Moisture Temperature: No Abnormality No Abnormalities Noted: No Dry / Scaly: No Maceration: No Moist: Yes Wound Preparation Ulcer Cleansing: Rinsed/Irrigated with Saline Topical Anesthetic Applied: Other: Lidocaine 4% Ointment, Treatment Notes Wound #1 (Left  Calcaneous) 1. Cleansed with: Clean wound with Normal Saline 2. Anesthetic Topical Lidocaine 4% cream to wound bed prior to debridement 4. Dressing Applied: Iodoform packing Gauze 5. Secondary Dressing Applied ABD and Kerlix/Conform 7. Secured with Psychologist, prison and probation services) Signed: 06/06/2014 5:45:58 PM By: Elliot Gurney, RN, BSN, Kim RN, BSN Entered By: Elliot Gurney, RN, BSN, Kim on 06/06/2014 16:24:21 MYISHIA, KASIK ([id number]) -------------------------------------------------------------------------------- Vitals Details Patient Name: Tracy Le Date of Service: 06/06/2014 3:45 PM Medical Record Number: [id number] Patient Account Number: [id number] Date of Birth/Sex: 1913/04/10 (81 y.o. Female) Treating RN: Huel Coventry Primary Care Physician: Corky Downs Other Clinician: Referring Physician: Corky Downs Treating Physician/Extender: Rudene Re in Treatment: 18 Vital Signs Time Taken: 16:16 Temperature (F): 98.3 Height (in): 69 Pulse (bpm): 70 Weight (lbs): 189 Respiratory Rate (breaths/min): 16 Body Mass Index (BMI): 27.9 Blood Pressure (mmHg): 101/60 Reference Range: 80 - 120 mg / dl Electronic Signature(s) Signed: 06/06/2014 5:45:58 PM By: Elliot Gurney, RN, BSN, Kim RN, BSN Entered By: Elliot Gurney, RN, BSN, Kim on 06/06/2014 16:20:17

## 2014-06-13 ENCOUNTER — Encounter: Payer: Medicare Other | Admitting: Surgery

## 2014-06-13 DIAGNOSIS — L89623 Pressure ulcer of left heel, stage 3: Secondary | ICD-10-CM | POA: Diagnosis not present

## 2014-06-13 NOTE — Progress Notes (Signed)
TALLULAH, HOSMAN ([id number]) Visit Report for 06/13/2014 Arrival Information Details Patient Name: CALANDRIA, MULLINGS Date of Service: 06/13/2014 2:30 PM Medical Record Number: [id number] Patient Account Number: [id number] Date of Birth/Sex: 11/11/1913 (81 y.o. Female) Treating RN: Renee Harder Primary Care Physician: Corky Downs Other Clinician: Referring Physician: Corky Downs Treating Physician/Extender: Rudene Re in Treatment: 19 Visit Information History Since Last Visit Added or deleted any medications: No Patient Arrived: Wheel Chair Any new allergies or adverse reactions: No Arrival Time: 14:40 Had a fall or experienced change in No Accompanied By: DIL activities of daily living that may affect Transfer Assistance: Manual risk of falls: Patient Identification Verified: Yes Signs or symptoms of abuse/neglect since last No Secondary Verification Process Yes visito Completed: Has Dressing in Place as Prescribed: Yes Patient Has Alerts: Yes Pain Present Now: No Patient Alerts: 1/16 ABI L: 1.23 Electronic Signature(s) Signed: 06/13/2014 4:30:08 PM By: Renee Harder RN Entered By: Renee Harder on 06/13/2014 14:41:10 [name] ([id number]) -------------------------------------------------------------------------------- Clinic Level of Care Assessment Details Patient Name: [name] Date of Service: 06/13/2014 2:30 PM Medical Record Number: [id number] Patient Account Number: [id number] Date of Birth/Sex: 21-Feb-1913 (81 y.o. Female) Treating RN: Renee Harder Primary Care Physician: Corky Downs Other Clinician: Referring Physician: Corky Downs Treating Physician/Extender: Rudene Re in Treatment: 19 Clinic Level of Care Assessment Items TOOL 4 Quantity Score  - Use when only an EandM is performed on FOLLOW-UP visit 0 ASSESSMENTS - Nursing Assessment / Reassessment  - Reassessment of Co-morbidities (includes updates in patient status) 0  -  Reassessment of Adherence to Treatment Plan 0 ASSESSMENTS - Wound and Skin Assessment / Reassessment X - Simple Wound Assessment / Reassessment - one wound 1 5  - Complex Wound Assessment / Reassessment - multiple wounds 0  - Dermatologic / Skin Assessment (not related to wound area) 0 ASSESSMENTS - Focused Assessment  - Circumferential Edema Measurements - multi extremities 0  - Nutritional Assessment / Counseling / Intervention 0 X - Lower Extremity Assessment (monofilament, tuning fork, pulses) 1 5  - Peripheral Arterial Disease Assessment (using hand held doppler) 0 ASSESSMENTS - Ostomy and/or Continence Assessment and Care  - Incontinence Assessment and Management 0  - Ostomy Care Assessment and Management (repouching, etc.) 0 PROCESS - Coordination of Care X - Simple Patient / Family Education for ongoing care 1 15  - Complex (extensive) Patient / Family Education for ongoing care 0  - Staff obtains Chiropractor, Records, Test Results / Process Orders 0  - Staff telephones HHA, Nursing Homes / Clarify orders / etc 0  - Routine Transfer to another Facility (non-emergent condition) 0 Jodoin, Neasia ([id number])  - Routine Hospital Admission (non-emergent condition) 0  - New Admissions / Manufacturing engineer / Ordering NPWT, Apligraf, etc. 0  - Emergency Hospital Admission (emergent condition) 0 X - Simple Discharge Coordination 1 10  - Complex (extensive) Discharge Coordination 0 PROCESS - Special Needs  - Pediatric / Minor Patient Management 0  - Isolation Patient Management 0  - Hearing / Language / Visual special needs 0  - Assessment of Community assistance (transportation, D/C planning, etc.) 0  - Additional assistance / Altered mentation 0  - Support Surface(s) Assessment (bed, cushion, seat, etc.) 0 INTERVENTIONS - Wound Cleansing / Measurement X - Simple Wound Cleansing - one wound 1 5  - Complex Wound Cleansing - multiple wounds  0 X - Wound Imaging (photographs - any number of wounds) 1 5  - Wound Tracing (instead of photographs) 0 X - Simple  Wound Measurement - one wound 1 5 []  - Complex Wound Measurement - multiple wounds 0 INTERVENTIONS - Wound Dressings X - Small Wound Dressing one or multiple wounds 1 10 []  - Medium Wound Dressing one or multiple wounds 0 []  - Large Wound Dressing one or multiple wounds 0 []  - Application of Medications - topical 0 []  - Application of Medications - injection 0 INTERVENTIONS - Miscellaneous []  - External ear exam 0 Biederman, Heavin ([id number]) []  - Specimen Collection (cultures, biopsies, blood, body fluids, etc.) 0 []  - Specimen(s) / Culture(s) sent or taken to Lab for analysis 0 []  - Patient Transfer (multiple staff / Michiel Sites Lift / Similar devices) 0 []  - Simple Staple / Suture removal (25 or less) 0 []  - Complex Staple / Suture removal (26 or more) 0 []  - Hypo / Hyperglycemic Management (close monitor of Blood Glucose) 0 []  - Ankle / Brachial Index (ABI) - do not check if billed separately 0 X - Vital Signs 1 5 Has the patient been seen at the hospital within the last three years: Yes Total Score: 65 Level Of Care: New/Established - Level 2 Electronic Signature(s) Signed: 06/13/2014 4:30:08 PM By: Renee Harder RN Entered By: Renee Harder on 06/13/2014 14:55:40 [name] ([id number]) -------------------------------------------------------------------------------- Encounter Discharge Information Details Patient Name: [name] Date of Service: 06/13/2014 2:30 PM Medical Record Number: [id number] Patient Account Number: [id number] Date of Birth/Sex: 26-Oct-1913 (81 y.o. Female) Treating RN: Renee Harder Primary Care Physician: Corky Downs Other Clinician: Referring Physician: Corky Downs Treating Physician/Extender: Rudene Re in Treatment: 19 Encounter Discharge Information Items Schedule Follow-up Appointment: No Medication Reconciliation  completed No and provided to Patient/Care Jennavecia Schwier: Provided on Clinical Summary of Care: 06/13/2014 Form Type Recipient Paper Patient LL Electronic Signature(s) Signed: 06/13/2014 3:07:53 PM By: Gwenlyn Perking Entered By: Gwenlyn Perking on 06/13/2014 15:07:53 [name] ([id number]) -------------------------------------------------------------------------------- Lower Extremity Assessment Details Patient Name: [name] Date of Service: 06/13/2014 2:30 PM Medical Record Number: [id number] Patient Account Number: [id number] Date of Birth/Sex: 1913-05-28 (81 y.o. Female) Treating RN: Renee Harder Primary Care Physician: Corky Downs Other Clinician: Referring Physician: Corky Downs Treating Physician/Extender: Rudene Re in Treatment: 19 Vascular Assessment Claudication: Claudication Assessment [Left:None] Pulses: Posterior Tibial Palpable: [Left:Yes] Dorsalis Pedis Palpable: [Left:Yes] Extremity colors, hair growth, and conditions: Extremity Color: [Left:Hyperpigmented] Hair Growth on Extremity: [Left:No] Temperature of Extremity: [Left:Warm] Capillary Refill: [Left:< 3 seconds] Dependent Rubor: [Left:No] Blanched when Elevated: [Left:No] Toe Nail Assessment Left: Right: Thick: Yes Discolored: Yes Deformed: Yes Improper Length and Hygiene: No Electronic Signature(s) Signed: 06/13/2014 4:30:08 PM By: Renee Harder RN Entered By: Renee Harder on 06/13/2014 14:43:10 Ricotta, Sharmain ([id number]) -------------------------------------------------------------------------------- Multi Wound Chart Details Patient Name: [name] Date of Service: 06/13/2014 2:30 PM Medical Record Number: [id number] Patient Account Number: [id number] Date of Birth/Sex: [birth year] (81 y.o. Female) Treating RN: Renee Harder Primary Care Physician: Corky Downs Other Clinician: Referring Physician: Corky Downs Treating Physician/Extender: Rudene Re in Treatment: 19 Vital  Signs Height(in): 69 Pulse(bpm): 74 Weight(lbs): 189 Blood Pressure 111/68 (mmHg): Body Mass Index(BMI): 28 Temperature(F): 98.1 Respiratory Rate 16 (breaths/min): Photos: [1:No Photos] [N/A:No Photos N/A] Wound Location: [1:Left Calcaneous] [N/A:Left Calcaneous N/A] Wounding Event: [1:Pressure Injury] [N/A:Pressure Injury N/A] Primary Etiology: [1:Pressure Ulcer] [N/A:Pressure Ulcer N/A] Comorbid History: [1:N/A] [N/A:History of pressure N/A wounds, Osteoarthritis, Dementia] Date Acquired: [1:12/31/2013] [N/A:12/31/2013 N/A] Weeks of Treatment: [1:19] [N/A:19 N/A] Wound Status: [1:Open] [N/A:Open N/A] Measurements L x W x D 0.3x0.7x0.5 [N/A:0.3x0.7x0.5 N/A] (cm) Area (cm) : [1:0.165] [N/A:0.165 N/A] Volume (cm) : [  1:0.082] [N/A:0.082 N/A] % Reduction in Area: [1:98.10%] [N/A:98.10% N/A] % Reduction in Volume: 90.70% [N/A:90.70% N/A] Starting Position 1 [N/A:12] (o'clock): Ending Position 1 [N/A:12] (o'clock): Maximum Distance 1 [N/A:0.7] (cm): Undermining: [1:N/A] [N/A:Yes N/A] Classification: [1:Category/Stage III] [N/A:Category/Stage III N/A] Exudate Amount: [1:N/A] [N/A:Small N/A] Exudate Type: [1:N/A] [N/A:Serous N/A] Exudate Color: [1:N/A] [N/A:amber N/A] Wound Margin: [1:N/A] [N/A:Distinct, outline attached N/A] Granulation Amount: [1:N/A] [N/A:Large (67-100%) N/A] Granulation Quality: [1:N/A] [N/A:Red N/A] Necrotic Amount: N/A Small (1-33%) N/A Epithelialization: N/A Small (1-33%) N/A Periwound Skin Texture: No Abnormalities Noted Edema: No N/A Excoriation: No Induration: No Callus: No Crepitus: No Fluctuance: No Friable: No Rash: No Scarring: No Periwound Skin No Abnormalities Noted Moist: Yes N/A Moisture: Maceration: No Dry/Scaly: No Periwound Skin Color: No Abnormalities Noted Atrophie Blanche: No N/A Cyanosis: No Ecchymosis: No Erythema: No Hemosiderin Staining: No Mottled: No Pallor: No Rubor: No Temperature: N/A No Abnormality  N/A Tenderness on No No N/A Palpation: Wound Preparation: N/A Ulcer Cleansing: N/A Rinsed/Irrigated with Saline Topical Anesthetic Applied: Other: Lidocaine 4% Ointment Treatment Notes Electronic Signature(s) Signed: 06/13/2014 4:30:08 PM By: Renee HarderMabry, Kelsey RN Entered By: Renee HarderMabry, Kelsey on 06/13/2014 14:52:10 Eduardo OsierLLOYD, Javae ([id number]) -------------------------------------------------------------------------------- Multi-Disciplinary Care Plan Details Patient Name: Eduardo OsierLLOYD, Sofi Date of Service: 06/13/2014 2:30 PM Medical Record Number: [id number] Patient Account Number: [id number] Date of Birth/Sex: 04/29/1913 (81 y.o. Female) Treating RN: Renee HarderMabry, Kelsey Primary Care Physician: Corky DownsMASOUD, JAVED Other Clinician: Referring Physician: Corky DownsMASOUD, JAVED Treating Physician/Extender: Rudene ReBritto, Errol Weeks in Treatment: 5719 Active Inactive Abuse / Safety / Falls / Self Care Management Nursing Diagnoses: Potential for falls Goals: Patient will remain injury free Date Initiated: 01/31/2014 Goal Status: Active Interventions: Assess fall risk on admission and as needed Notes: Nutrition Nursing Diagnoses: Potential for alteratiion in Nutrition/Potential for imbalanced nutrition Goals: Patient/caregiver agrees to and verbalizes understanding of need to use nutritional supplements and/or vitamins as prescribed Date Initiated: 01/31/2014 Goal Status: Active Interventions: Assess patient nutrition upon admission and as needed per policy Notes: Pressure Nursing Diagnoses: Potential for impaired tissue integrity related to pressure, friction, moisture, and shear Goals: Patient will remain free from development of additional pressure ulcers Flemings, Kyrin ([id number]) Date Initiated: 01/31/2014 Goal Status: Active Interventions: Assess potential for pressure ulcer upon admission and as needed Notes: Wound/Skin Impairment Nursing Diagnoses: Impaired tissue integrity Goals: Ulcer/skin breakdown will have  a volume reduction of 30% by week 4 Date Initiated: 01/31/2014 Goal Status: Active Interventions: Assess ulceration(s) every visit Notes: Electronic Signature(s) Signed: 06/13/2014 4:30:08 PM By: Renee HarderMabry, Kelsey RN Entered By: Renee HarderMabry, Kelsey on 06/13/2014 14:36:20 Basaldua, Tequisha ([id number]) -------------------------------------------------------------------------------- Pain Assessment Details Patient Name: Eduardo OsierLLOYD, Mayme Date of Service: 06/13/2014 2:30 PM Medical Record Number: [id number] Patient Account Number: [id number] Date of Birth/Sex: 04/29/1913 (81 y.o. Female) Treating RN: Renee HarderMabry, Kelsey Primary Care Physician: Corky DownsMASOUD, JAVED Other Clinician: Referring Physician: Corky DownsMASOUD, JAVED Treating Physician/Extender: Rudene ReBritto, Errol Weeks in Treatment: 19 Active Problems Location of Pain Severity and Description of Pain Patient Has Paino No Site Locations Pain Management and Medication Current Pain Management: Electronic Signature(s) Signed: 06/13/2014 4:30:08 PM By: Renee HarderMabry, Kelsey RN Entered By: Renee HarderMabry, Kelsey on 06/13/2014 14:41:21 Eduardo OsierLLOYD, Alley ([id number]) -------------------------------------------------------------------------------- Patient/Caregiver Education Details Patient Name: Eduardo OsierLLOYD, Josely Date of Service: 06/13/2014 2:30 PM Medical Record Number: [id number] Patient Account Number: [id number] Date of Birth/Gender: 04/29/1913 (81 y.o. Female) Treating RN: Renee HarderMabry, Kelsey Primary Care Physician: Corky DownsMASOUD, JAVED Other Clinician: Referring Physician: Corky DownsMASOUD, JAVED Treating Physician/Extender: Rudene ReBritto, Errol Weeks in Treatment: 4719 Education Assessment Education Provided To: Patient Education Topics Provided Offloading: Methods: Explain/Verbal Responses: State content correctly Pressure:  Methods: Explain/Verbal Responses: State content correctly Safety: Methods: Explain/Verbal Responses: State content correctly Wound/Skin Impairment: Methods: Explain/Verbal Responses: State content  correctly Electronic Signature(s) Signed: 06/13/2014 4:30:08 PM By: Renee HarderMabry, Kelsey RN Entered By: Renee HarderMabry, Kelsey on 06/13/2014 14:57:06 Horwitz, Rubbie ([id number]) -------------------------------------------------------------------------------- Wound Assessment Details Patient Name: Eduardo OsierLLOYD, Kariel Date of Service: 06/13/2014 2:30 PM Medical Record Number: [id number] Patient Account Number: [id number] Date of Birth/Sex: 07-Oct-1913 (81 y.o. Female) Treating RN: Renee HarderMabry, Kelsey Primary Care Physician: Corky DownsMASOUD, JAVED Other Clinician: Referring Physician: Corky DownsMASOUD, JAVED Treating Physician/Extender: Rudene ReBritto, Errol Weeks in Treatment: 19 Wound Status Wound Number: 1 Primary Etiology: Pressure Ulcer Wound Location: Left Calcaneous Wound Status: Open Wounding Event: Pressure Injury Date Acquired: 12/31/2013 Weeks Of Treatment: 19 Clustered Wound: No Photos Photo Uploaded By: Renee HarderMabry, Kelsey on 06/13/2014 16:29:21 Wound Measurements Length: (cm) 0.3 Width: (cm) 0.7 Depth: (cm) 0.5 Area: (cm) 0.165 Volume: (cm) 0.082 % Reduction in Area: 98.1% % Reduction in Volume: 90.7% Wound Description Classification: Category/Stage III Periwound Skin Texture Texture Color No Abnormalities Noted: No No Abnormalities Noted: No Moisture No Abnormalities Noted: No Electronic Signature(s) Signed: 06/13/2014 4:30:08 PM By: Renee HarderMabry, Kelsey RN Sharon SellerLLOYD, Francina ([id number]) Entered By: Renee HarderMabry, Kelsey on 06/13/2014 14:51:15 Eduardo OsierLLOYD, Shaterrica ([id number]) -------------------------------------------------------------------------------- Wound Assessment Details Patient Name: Eduardo OsierLLOYD, Jessicca Date of Service: 06/13/2014 2:30 PM Medical Record Number: [id number] Patient Account Number: [id number] Date of Birth/Sex: 07-Oct-1913 (81 y.o. Female) Treating RN: Renee HarderMabry, Kelsey Primary Care Physician: Corky DownsMASOUD, JAVED Other Clinician: Referring Physician: Corky DownsMASOUD, JAVED Treating Physician/Extender: Rudene ReBritto, Errol Weeks in Treatment: 19 Wound  Status Wound Number: 1 Primary Pressure Ulcer Etiology: Wound Location: Left Calcaneous Wound Open Wounding Event: Pressure Injury Status: Date Acquired: 12/31/2013 Comorbid History of pressure wounds, Weeks Of Treatment: 19 History: Osteoarthritis, Dementia Clustered Wound: No Wound Measurements Length: (cm) 0.3 Width: (cm) 0.7 Depth: (cm) 0.5 Area: (cm) 0.165 Volume: (cm) 0.082 % Reduction in Area: 98.1% % Reduction in Volume: 90.7% Epithelialization: Small (1-33%) Tunneling: No Undermining: Yes Starting Position (o'clock): 12 Ending Position (o'clock): 12 Maximum Distance: (cm) 0.7 Wound Description Classification: Category/Stage III Wound Margin: Distinct, outline attached Exudate Amount: Small Exudate Type: Serous Exudate Color: amber Foul Odor After Cleansing: No Wound Bed Granulation Amount: Large (67-100%) Exposed Structure Granulation Quality: Red Fascia Exposed: No Necrotic Amount: Small (1-33%) Fat Layer Exposed: No Necrotic Quality: Adherent Slough Tendon Exposed: No Muscle Exposed: No Joint Exposed: No Bone Exposed: No Limited to Skin Breakdown Periwound Skin Texture Texture Color No Abnormalities Noted: No No Abnormalities Noted: No Osuch, Symantha ([id number]) Callus: No Atrophie Blanche: No Crepitus: No Cyanosis: No Excoriation: No Ecchymosis: No Fluctuance: No Erythema: No Friable: No Hemosiderin Staining: No Induration: No Mottled: No Localized Edema: No Pallor: No Rash: No Rubor: No Scarring: No Temperature / Pain Moisture Temperature: No Abnormality No Abnormalities Noted: No Dry / Scaly: No Maceration: No Moist: Yes Wound Preparation Ulcer Cleansing: Rinsed/Irrigated with Saline Topical Anesthetic Applied: Other: Lidocaine 4% Ointment, Treatment Notes Wound #1 (Left Calcaneous) 1. Cleansed with: Clean wound with Normal Saline 2. Anesthetic Topical Lidocaine 4% cream to wound bed prior to debridement 4. Dressing  Applied: Iodoform packing Gauze 5. Secondary Dressing Applied Guaze, ABD and kerlix/Conform Electronic Signature(s) Signed: 06/13/2014 4:30:08 PM By: Renee HarderMabry, Kelsey RN Entered By: Renee HarderMabry, Kelsey on 06/13/2014 14:52:00 Eduardo OsierLLOYD, Maryln ([id number]) -------------------------------------------------------------------------------- Vitals Details Patient Name: Eduardo OsierLLOYD, Shalisa Date of Service: 06/13/2014 2:30 PM Medical Record Number: [id number] Patient Account Number: [id number] Date of Birth/Sex: 07-Oct-1913 (81 y.o. Female) Treating RN: Renee HarderMabry, Kelsey Primary Care Physician: Corky DownsMASOUD, JAVED Other Clinician: Referring Physician: Corky DownsMASOUD, JAVED Treating  Physician/Extender: Rudene Re in Treatment: 19 Vital Signs Time Taken: 14:40 Temperature (F): 98.1 Height (in): 69 Pulse (bpm): 74 Weight (lbs): 189 Respiratory Rate (breaths/min): 16 Body Mass Index (BMI): 27.9 Blood Pressure (mmHg): 111/68 Reference Range: 80 - 120 mg / dl Electronic Signature(s) Signed: 06/13/2014 4:30:08 PM By: Renee Harder RN Entered By: Renee Harder on 06/13/2014 14:41:55

## 2014-06-13 NOTE — Progress Notes (Signed)
Tracy Le, Larina ([id number]) Visit Report for 06/13/2014 Chief Complaint Document Details Patient Name: Tracy Le, Lakeishia Date of Service: 06/13/2014 2:30 PM Medical Record Number: [id number] Patient Account Number: [id number] Date of Birth/Sex: [birth year] (81 y.o. 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 She has no fresh complaints. she does not walk around much except transferring from a chair to the bed. Electronic Signature(s) Signed: 06/13/2014 1:32:08 PM By: Evlyn KannerBritto, Dalante Minus MD, FACS Entered By: Evlyn KannerBritto, Lorelei Heikkila on 06/13/2014 15:00:25 Tracy Le, Lizza ([id number]) -------------------------------------------------------------------------------- HPI Details Patient Name: Tracy Le, Mackenzy Date of Service: 06/13/2014 2:30 PM Medical Record Number: [id number] Patient Account Number: [id number] Date of Birth/Sex: [birth year] (81 y.o. Female) Treating RN: Renee HarderMabry, Kelsey Primary Care Physician: Corky DownsMASOUD, JAVED Other Clinician: Referring Physician: Corky DownsMASOUD, JAVED Treating Physician/Extender: Rudene ReBritto, Adrinne Sze Weeks in Treatment: 19 History of Present Illness Location: left calcaneus Quality: Patient reports No Pain. Severity: Patient states wound (s) are getting better. Duration: Patient states that they are not certain how long the wound has been present. Timing: nil Context: The wound appeared gradually over time Modifying Factors: sitting most of the day Associated Signs and Symptoms: Patient reports having difficulty standing for long periods. HPI Description: The patient is an 81 year old female with a treatment history of a stage III left calcaneal wound. The patient is mostly bedbound at home. She  does spend some time sitting up in a chair. She does not have an air mattress at home. Her caretaker has been using triple antibiotic on the bone itself. She denies any fevers at home. The caretaker denies any history of diabetes or vascular disease. Since being seen here here in the wound clinic she has been applying Santyl to the ulcer base. the patient returns for follow-up today. They have been compliant with dressing changes. 03/29/14 -- the dressings have been done on a daily basis according to plan and the daughter says that she is running out of Santyl ointment and would need a new prescription. 04/26/2014 patient's daughter-in-law is comfortable doing the dressing and after discussing with her it is okay if the home health stops going to do the dressing once a week. 03/29/14 - she seems to be doing fine and has no fresh issues. The daughter has been doing her dressings on a regular basis and has been doing them according to the plan. 05/09/2014 the daughter noticed a small ulcer on the right buttock medially which is tiny and a stage II pressure ulcer. Electronic Signature(s) Signed: 06/13/2014 1:32:08 PM By: Evlyn KannerBritto, Fayez Sturgell MD, FACS Entered By: Evlyn KannerBritto, Seleen Walter on 06/13/2014 15:00:31 Tracy Le, Eyana ([id number]) -------------------------------------------------------------------------------- Physical Exam Details Patient Name: Tracy Le, Saloni Date of Service: 06/13/2014 2:30 PM Medical Record Number: [id number] Patient Account Number: [id number] Date of Birth/Sex: [birth year] (81 y.o. Female) Treating RN: Renee HarderMabry, Kelsey Primary Care Physician: Corky DownsMASOUD, JAVED Other Clinician: Referring Physician: Corky DownsMASOUD, JAVED Treating Physician/Extender: Rudene ReBritto, Wetona Viramontes Weeks in Treatment: 19 Constitutional . Pulse regular. Respirations normal and unlabored. Afebrile. . Eyes Nonicteric. Reactive to light. Ears, Nose, Mouth, and Throat Lips, teeth, and gums WNL.Marland Kitchen. Moist mucosa without lesions . Neck supple and  nontender. No palpable supraclavicular or cervical adenopathy. Normal sized without goiter. Respiratory WNL. No retractions.. Cardiovascular Pedal Pulses WNL. she has bilateral pedal edema and little bit on her legs to which  is +1 pitting. Integumentary (Hair, Skin) the pressure ulcer on her left heel has minimal undermining towards the lateral aspect.Marland Kitchen Psychiatric Judgement and insight Intact.. No evidence of depression, anxiety, or agitation.. Electronic Signature(s) Signed: 06/13/2014 1:32:08 PM By: Evlyn Kanner MD, FACS Entered By: Evlyn Kanner on 06/13/2014 15:01:28 CASY, TAVANO ([id number]) -------------------------------------------------------------------------------- Physician Orders Details Patient Name: Tracy Osier Date of Service: 06/13/2014 2:30 PM Medical Record Number: [id number] Patient Account Number: [id number] Date of Birth/Sex: Jun 01, 1913 (81 y.o. Female) Treating RN: Renee Harder Primary Care Physician: Corky Downs Other Clinician: Referring Physician: Corky Downs Treating Physician/Extender: Rudene Re in Treatment: 77 Verbal / Phone Orders: No Diagnosis Coding Wound Cleansing Wound #1 Left Calcaneous o Clean wound with Normal Saline. Anesthetic Wound #1 Left Calcaneous o Topical Lidocaine 4% cream applied to wound bed prior to debridement Primary Wound Dressing Wound #1 Left Calcaneous o Iodoform packing Gauze Secondary Dressing Wound #1 Left Calcaneous o ABD and Kerlix/Conform Dressing Change Frequency Wound #1 Left Calcaneous o Change dressing every other day. Follow-up Appointments Wound #1 Left Calcaneous o Return Appointment in 1 week. Off-Loading Wound #1 Left Calcaneous o Turn and reposition every 2 hours Additional Orders / Instructions Wound #1 Left Calcaneous o Increase protein intake. o Activity as tolerated o Other: - sage boots Babel, Yanelis ([id number]) Electronic Signature(s) Signed: 06/13/2014  4:30:08 PM By: Renee Harder RN Signed: 06/13/2014 1:32:08 PM By: Evlyn Kanner MD, FACS Entered By: Renee Harder on 06/13/2014 14:55:15 Tracy Osier ([id number]) -------------------------------------------------------------------------------- Problem List Details Patient Name: Tracy Osier Date of Service: 06/13/2014 2:30 PM Medical Record Number: [id number] Patient Account Number: [id number] Date of Birth/Sex: May 28, 1913 (81 y.o. Female) Treating RN: Renee Harder Primary Care Physician: Corky Downs Other Clinician: Referring Physician: Corky Downs Treating Physician/Extender: Rudene Re in Treatment: 19 Active Problems ICD-10 Encounter Code Description Active Date Diagnosis L89.623 Pressure ulcer of left heel, stage 3 01/31/2014 Yes L89.312 Pressure ulcer of right buttock, stage 2 05/09/2014 Yes Inactive Problems Resolved Problems Electronic Signature(s) Signed: 06/13/2014 1:32:08 PM By: Evlyn Kanner MD, FACS Entered By: Evlyn Kanner on 06/13/2014 15:00:17 Tracy Osier ([id number]) -------------------------------------------------------------------------------- Progress Note Details Patient Name: Tracy Osier Date of Service: 06/13/2014 2:30 PM Medical Record Number: [id number] Patient Account Number: [id number] Date of Birth/Sex: 05-15-1913 (81 y.o. 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 She has no fresh complaints. she does not walk around much except transferring from a chair to the bed. History of Present Illness (HPI) The following HPI elements were documented for the patient's wound: Location: left calcaneus Quality: Patient reports  No Pain. Severity: Patient states wound (s) are getting better. Duration: Patient states that they are not certain how long the wound has been present. Timing: nil Context: The wound appeared gradually over time Modifying Factors: sitting most of the day Associated Signs and Symptoms: Patient reports having difficulty standing for long periods. The patient is an 81 year old female with a treatment history of a stage III left calcaneal wound. The patient is mostly bedbound at home. She does spend some time sitting up in a chair. She does not have an air mattress at home. Her caretaker has been using triple antibiotic on the bone itself. She denies any fevers at home. The caretaker denies any history  of diabetes or vascular disease. Since being seen here here in the wound clinic she has been applying Santyl to the ulcer base. the patient returns for follow-up today. They have been compliant with dressing changes. 03/29/14 -- the dressings have been done on a daily basis according to plan and the daughter says that she is running out of Santyl ointment and would need a new prescription. 04/26/2014 patient's daughter-in-law is comfortable doing the dressing and after discussing with her it is okay if the home health stops going to do the dressing once a week. 03/29/14 - she seems to be doing fine and has no fresh issues. The daughter has been doing her dressings on a regular basis and has been doing them according to the plan. 05/09/2014 the daughter noticed a small ulcer on the right buttock medially which is tiny and a stage II pressure ulcer. RAELAN, BURGOON ([id number]) Objective Constitutional Pulse regular. Respirations normal and unlabored. Afebrile. Vitals Time Taken: 2:40 PM, Height: 69 in, Weight: 189 lbs, BMI: 27.9, Temperature: 98.1 F, Pulse: 74 bpm, Respiratory Rate: 16 breaths/min, Blood Pressure: 111/68 mmHg. Eyes Nonicteric. Reactive to light. Ears, Nose, Mouth, and Throat Lips,  teeth, and gums WNL.Marland Kitchen Moist mucosa without lesions . Neck supple and nontender. No palpable supraclavicular or cervical adenopathy. Normal sized without goiter. Respiratory WNL. No retractions.. Cardiovascular Pedal Pulses WNL. she has bilateral pedal edema and little bit on her legs to which is +1 pitting. Psychiatric Judgement and insight Intact.. No evidence of depression, anxiety, or agitation.. Integumentary (Hair, Skin) the pressure ulcer on her left heel has minimal undermining towards the lateral aspect.. Wound #1 status is Open. Original cause of wound was Pressure Injury. The wound is located on the Left Calcaneous. The wound measures 0.3cm length x 0.7cm width x 0.5cm depth; 0.165cm^2 area and 0.082cm^3 volume. Wound #1 status is Open. Original cause of wound was Pressure Injury. The wound is located on the Left Calcaneous. The wound measures 0.3cm length x 0.7cm width x 0.5cm depth; 0.165cm^2 area and 0.082cm^3 volume. The wound is limited to skin breakdown. There is no tunneling noted, however, there is undermining starting at 12:00 and ending at 12:00 with a maximum distance of 0.7cm. There is a small amount of serous drainage noted. The wound margin is distinct with the outline attached to the wound base. There is large (67-100%) red granulation within the wound bed. There is a small (1-33%) amount of necrotic tissue within the wound bed including Adherent Slough. The periwound skin appearance exhibited: Moist. The periwound skin appearance did not exhibit: Callus, Crepitus, Excoriation, Fluctuance, Friable, Induration, Localized Edema, Rash, Scarring, Dry/Scaly, Maceration, Atrophie Blanche, Cyanosis, Ecchymosis, Hemosiderin Staining, Mottled, Pallor, Rubor, Erythema. Periwound temperature was noted as Rosander, Daizee ([id number]) No Abnormality. Assessment Active Problems ICD-10 Y86.578 - Pressure ulcer of left heel, stage 3 L89.312 - Pressure ulcer of right buttock, stage  2 We will continue with the iodoform gauze packing and also apply offloading device as much as possible. She'll come back and see me next week. Plan Wound Cleansing: Wound #1 Left Calcaneous: Clean wound with Normal Saline. Anesthetic: Wound #1 Left Calcaneous: Topical Lidocaine 4% cream applied to wound bed prior to debridement Primary Wound Dressing: Wound #1 Left Calcaneous: Iodoform packing Gauze Secondary Dressing: Wound #1 Left Calcaneous: ABD and Kerlix/Conform Dressing Change Frequency: Wound #1 Left Calcaneous: Change dressing every other day. Follow-up Appointments: Wound #1 Left Calcaneous: Return Appointment in 1 week. Off-Loading: Wound #1 Left Calcaneous: Turn and reposition every 2  hours Additional Orders / Instructions: Wound #1 Left Calcaneous: Increase protein intake. Activity as tolerated Other: - sage boots Markuson, Amanii ([id number]) We will continue with the iodoform gauze packing and also apply offloading device as much as possible. She'll come back and see me next week. Electronic Signature(s) Signed: 06/13/2014 1:32:08 PM By: Evlyn KannerBritto, Jiraiya Mcewan MD, FACS Entered By: Evlyn KannerBritto, Stone Spirito on 06/13/2014 15:02:24 Tracy Le, Roslyn ([id number]) -------------------------------------------------------------------------------- SuperBill Details Patient Name: Tracy Le, Maedell Date of Service: 06/13/2014 Medical Record Number: [id number] Patient Account Number: [id number] Date of Birth/Sex: 1913/12/06 (81 y.o. Female) Treating RN: Renee HarderMabry, Kelsey Primary Care Physician: Corky DownsMASOUD, JAVED Other Clinician: Referring Physician: Corky DownsMASOUD, JAVED Treating Physician/Extender: Rudene ReBritto, Alexandr Yaworski Weeks in Treatment: 19 Diagnosis Coding ICD-10 Codes Code Description [phone] Pressure ulcer of left heel, stage 3 L89.312 Pressure ulcer of right buttock, stage 2 Facility Procedures CPT4 Code: [id number] Description: [phone] - WOUND CARE VISIT-LEV 2 EST PT Modifier: Quantity: 1 Physician Procedures CPT4  Code: [id number] Description: 99213 - WC PHYS LEVEL 3 - EST PT ICD-10 Description Diagnosis L89.623 Pressure ulcer of left heel, stage 3 Modifier: Quantity: 1 Electronic Signature(s) Signed: 06/13/2014 1:32:08 PM By: Evlyn KannerBritto, Bereket Gernert MD, FACS Entered By: Evlyn KannerBritto, Chandi Nicklin on 06/13/2014 15:02:47

## 2014-06-20 ENCOUNTER — Encounter: Payer: Medicare Other | Admitting: Surgery

## 2014-06-20 DIAGNOSIS — L89623 Pressure ulcer of left heel, stage 3: Secondary | ICD-10-CM | POA: Diagnosis not present

## 2014-06-21 NOTE — Progress Notes (Signed)
TIARA, MAULTSBY ([id number]) Visit Report for 06/20/2014 Chief Complaint Document Details Patient Name: Tracy Le, Tracy Le Date of Service: 06/20/2014 4:00 PM Medical Record Number: [id number] Patient Account Number: [id number] Date of Birth/Sex: [birth year] (81 y.o. 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 She has no fresh complaints. she does not walk around much except transferring from a chair to the bed. Electronic Signature(s) Signed: 06/20/2014 5:41:46 PM By: Evlyn Kanner MD, FACS Entered By: Evlyn Kanner on 06/20/2014 17:01:06 Tracy Le, Tracy Le ([id number]) -------------------------------------------------------------------------------- Debridement Details Patient Name: Tracy Le Date of Service: 06/20/2014 4:00 PM Medical Record Number: [id number] Patient Account Number: [id number] Date of Birth/Sex: 1914/01/18 (81 y.o. Female) Treating RN: Kristin Bruins Primary Care Physician: Corky Downs Other Clinician: Referring Physician: Corky Downs Treating Physician/Extender: Rudene Re in Treatment: 20 Debridement Performed for Wound #1 Left Calcaneous Assessment: Performed By: Physician Tristan Schroeder., MD Debridement: Open Wound/Selective Debridement Selective Description: Pre-procedure Yes Verification/Time Out Taken: Start Time: 16:45 Pain Control: Lidocaine 4% Topical Solution Level: Non-Viable Tissue Total Area Debrided (L x 0.4 (cm) x 0.5 (cm) = 0.2 (cm) W): Tissue and other Non-Viable, Callus, Eschar, Skin material debrided: Instrument: Forceps, Scissors Bleeding: None End Time: 16:48 Procedural Pain: 0 Post Procedural Pain: 0 Response to  Treatment: Procedure was tolerated well Post Debridement Measurements of Total Wound Length: (cm) 0.4 Stage: Category/Stage III Width: (cm) 0.5 Depth: (cm) 0.5 Volume: (cm) 0.079 Electronic Signature(s) Signed: 06/20/2014 5:11:50 PM By: Kristin Bruins Signed: 06/20/2014 5:41:46 PM By: Evlyn Kanner MD, FACS Entered By: Evlyn Kanner on 06/20/2014 17:00:43 Tracy Le ([id number]) -------------------------------------------------------------------------------- HPI Details Patient Name: Tracy Le Date of Service: 06/20/2014 4:00 PM Medical Record Number: [id number] Patient Account Number: [id number] Date of Birth/Sex: Jul 20, 1913 (81 y.o. Female) Treating RN: Kristin Bruins Primary Care Physician: Corky Downs Other Clinician: Referring Physician: Corky Downs Treating Physician/Extender: Rudene Re in Treatment: 20 History of Present Illness Location: left calcaneus Quality: Patient reports No Pain. Severity: Patient states wound (s) are getting better. Duration: Patient states that they are not certain how long the wound has been present. Timing: nil Context: The wound appeared gradually over time Modifying Factors: sitting most of the day Associated Signs and Symptoms: Patient reports having difficulty standing for long periods. HPI Description: The patient is an 81 year old female with a treatment history of a stage III left calcaneal wound. The patient is mostly bedbound at home. She does spend some time sitting up in a chair. She does not have an air mattress at home. Her caretaker has been using triple antibiotic on the bone itself. She denies any fevers at home. The caretaker denies any history of diabetes or vascular disease. Since being seen here here in the wound clinic she has been applying Santyl to the ulcer base. the patient returns for follow-up today. They have been compliant with dressing changes. 03/29/14 -- the dressings have been done on a daily  basis according to plan and the daughter says that she is running out of Santyl ointment and would need a new prescription. 04/26/2014 patient's daughter-in-law is comfortable doing the dressing and after discussing with her it is okay if the home health stops going to do the dressing once a week. 03/29/14 - she seems to be doing fine and  has no fresh issues. The daughter has been doing her dressings on a regular basis and has been doing them according to the plan. 05/09/2014 the daughter noticed a small ulcer on the right buttock medially which is tiny and a stage II pressure ulcer. Electronic Signature(s) Signed: 06/20/2014 5:41:46 PM By: Evlyn Kanner MD, FACS Entered By: Evlyn Kanner on 06/20/2014 17:01:17 Tracy Le, Tracy Le ([id number]) -------------------------------------------------------------------------------- Physical Exam Details Patient Name: Tracy Le Date of Service: 06/20/2014 4:00 PM Medical Record Number: [id number] Patient Account Number: [id number] Date of Birth/Sex: 06/05/1913 (81 y.o. Female) Treating RN: Kristin Bruins Primary Care Physician: Corky Downs Other Clinician: Referring Physician: Corky Downs Treating Physician/Extender: Rudene Re in Treatment: 20 Constitutional . Pulse regular. Respirations normal and unlabored. Afebrile. . Eyes Nonicteric. Reactive to light. Ears, Nose, Mouth, and Throat Lips, teeth, and gums WNL.Marland Kitchen Moist mucosa without lesions . Neck supple and nontender. No palpable supraclavicular or cervical adenopathy. Normal sized without goiter. Respiratory WNL. No retractions.. Cardiovascular Pedal Pulses WNL. as got some pedal edema both lower extremities and also on the lower part of her left leg.. Integumentary (Hair, Skin) the ulcerated area on the left heel is undermining between the 12 and 3:00 position and this is about a centimeter in depth.. No crepitus or fluctuance. No peri-wound warmth or erythema. No  masses.Marland Kitchen Psychiatric Judgement and insight Intact.. No evidence of depression, anxiety, or agitation.. Electronic Signature(s) Signed: 06/20/2014 5:41:46 PM By: Evlyn Kanner MD, FACS Entered By: Evlyn Kanner on 06/20/2014 17:02:50 Tracy Le, Tracy Le ([id number]) -------------------------------------------------------------------------------- Physician Orders Details Patient Name: Tracy Le Date of Service: 06/20/2014 4:00 PM Medical Record Number: [id number] Patient Account Number: [id number] Date of Birth/Sex: 03/31/1913 (81 y.o. Female) Treating RN: Renee Harder Primary Care Physician: Corky Downs Other Clinician: Referring Physician: Corky Downs Treating Physician/Extender: Rudene Re in Treatment: 20 Verbal / Phone Orders: Yes Clinician: Renee Harder Read Back and Verified: Yes Diagnosis Coding Wound Cleansing Wound #1 Left Calcaneous o Clean wound with Normal Saline. Anesthetic Wound #1 Left Calcaneous o Topical Lidocaine 4% cream applied to wound bed prior to debridement Primary Wound Dressing Wound #1 Left Calcaneous o Iodoform packing Gauze Secondary Dressing Wound #1 Left Calcaneous o ABD and Kerlix/Conform Dressing Change Frequency Wound #1 Left Calcaneous o Change dressing every other day. Follow-up Appointments Wound #1 Left Calcaneous o Return Appointment in 1 week. Off-Loading Wound #1 Left Calcaneous o Turn and reposition every 2 hours Additional Orders / Instructions Wound #1 Left Calcaneous o Increase protein intake. o Activity as tolerated o Other: - sage boots Parton, Tityana ([id number]) Electronic Signature(s) Signed: 06/20/2014 5:30:05 PM By: Renee Harder RN Signed: 06/20/2014 5:41:46 PM By: Evlyn Kanner MD, FACS Entered By: Renee Harder on 06/20/2014 16:49:39 GENNA, CASIMIR ([id number]) -------------------------------------------------------------------------------- Problem List Details Patient Name: Tracy Le Date  of Service: 06/20/2014 4:00 PM Medical Record Number: [id number] Patient Account Number: [id number] Date of Birth/Sex: 1913-06-14 (81 y.o. Female) Treating RN: Kristin Bruins Primary Care Physician: Corky Downs Other Clinician: Referring Physician: Corky Downs Treating Physician/Extender: Rudene Re in Treatment: 20 Active Problems ICD-10 Encounter Code Description Active Date Diagnosis L89.623 Pressure ulcer of left heel, stage 3 01/31/2014 Yes L89.312 Pressure ulcer of right buttock, stage 2 05/09/2014 Yes Inactive Problems Resolved Problems Electronic Signature(s) Signed: 06/20/2014 5:41:46 PM By: Evlyn Kanner MD, FACS Entered By: Evlyn Kanner on 06/20/2014 17:00:11 Tracy Le ([id number]) -------------------------------------------------------------------------------- Progress Note Details Patient Name: Tracy Le Date of Service: 06/20/2014 4:00 PM Medical Record Number: [id number] Patient Account Number: [id number] Date of Birth/Sex: February 05, 1913 (81  y.o. 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 She has no fresh complaints. she does not walk around much except transferring from a chair to the bed. History of Present Illness (HPI) The following HPI elements were documented for the patient's wound: Location: left calcaneus Quality: Patient reports No Pain. Severity: Patient states wound (s) are getting better. Duration: Patient states that they are not certain how long the wound has been present. Timing: nil Context: The wound appeared gradually over time Modifying Factors: sitting most of the day Associated Signs and Symptoms:  Patient reports having difficulty standing for long periods. The patient is an 81 year old female with a treatment history of a stage III left calcaneal wound. The patient is mostly bedbound at home. She does spend some time sitting up in a chair. She does not have an air mattress at home. Her caretaker has been using triple antibiotic on the bone itself. She denies any fevers at home. The caretaker denies any history of diabetes or vascular disease. Since being seen here here in the wound clinic she has been applying Santyl to the ulcer base. the patient returns for follow-up today. They have been compliant with dressing changes. 03/29/14 -- the dressings have been done on a daily basis according to plan and the daughter says that she is running out of Santyl ointment and would need a new prescription. 04/26/2014 patient's daughter-in-law is comfortable doing the dressing and after discussing with her it is okay if the home health stops going to do the dressing once a week. 03/29/14 - she seems to be doing fine and has no fresh issues. The daughter has been doing her dressings on a regular basis and has been doing them according to the plan. 05/09/2014 the daughter noticed a small ulcer on the right buttock medially which is tiny and a stage II pressure ulcer. Tracy OsierLLOYD, Tracy Le ([id number]) Objective Constitutional Pulse regular. Respirations normal and unlabored. Afebrile. Vitals Time Taken: 4:25 PM, Height: 69 in, Weight: 189 lbs, BMI: 27.9, Temperature: 98.2 F, Pulse: 75 bpm, Respiratory Rate: 12 breaths/min, Blood Pressure: 118/59 mmHg. Eyes Nonicteric. Reactive to light. Ears, Nose, Mouth, and Throat Lips, teeth, and gums WNL.Marland Kitchen. Moist mucosa without lesions . Neck supple and nontender. No palpable supraclavicular or cervical adenopathy. Normal sized without goiter. Respiratory WNL. No retractions.. Cardiovascular Pedal Pulses WNL. as got some pedal edema both lower extremities and also on  the lower part of her left leg.Marland Kitchen. Psychiatric Judgement and insight Intact.. No evidence of depression, anxiety, or agitation.. Integumentary (Hair, Skin) the ulcerated area on the left heel is undermining between the 12 and 3:00 position and this is about a centimeter in depth.. No crepitus or fluctuance. No peri-wound warmth or erythema. No masses.. Wound #1 status is Open. Original cause of wound was Pressure Injury. The wound is located on the Left Calcaneous. The wound measures 0.4cm length x 0.5cm width x 0.5cm depth; 0.157cm^2 area and 0.079cm^3 volume. The wound is limited to skin breakdown. There is no tunneling noted, however, there is undermining starting at 12:00 and ending at 3:00 with a maximum distance of 1cm. There is a small amount of serous drainage noted. The wound margin is distinct with the outline attached to the wound  base. There is large (67-100%) red granulation within the wound bed. There is a small (1-33%) amount of necrotic tissue within the wound bed including Adherent Slough. The periwound skin appearance exhibited: Moist. The periwound skin appearance did not exhibit: Callus, Crepitus, Excoriation, Fluctuance, Friable, Induration, Localized Edema, Rash, Scarring, Dry/Scaly, Maceration, Atrophie Blanche, Cyanosis, Ecchymosis, Hemosiderin Staining, Mottled, Pallor, Rubor, Erythema. Periwound temperature was noted as No Abnormality. Tracy Le, Tracy Le ([id number]) The ulcerated area on the left heel is undermining between the 12 and 3:00 position and this is about a centimeter in depth. We'll continue packing it lightly with iodoform gauze and see her back next week. Discussed the dressing details with her daughter-in-law who is her caregiver. Assessment Active Problems ICD-10 W09.811 - Pressure ulcer of left heel, stage 3 L89.312 - Pressure ulcer of right buttock, stage 2 Procedures Wound #1 Wound #1 is a Pressure Ulcer located on the Left Calcaneous . There was a  Non-Viable Tissue Open Wound/Selective [phone]) debridement with total area of 0.2 sq cm performed by Goldye Tourangeau, Ignacia Felling., MD. with the following instrument(s): Forceps and Scissors to remove Non-Viable tissue/material including Eschar, Skin, and Callus after achieving pain control using Lidocaine 4% Topical Solution. A time out was conducted prior to the start of the procedure. There was no bleeding. The procedure was tolerated well with a pain level of 0 throughout and a pain level of 0 following the procedure. Post Debridement Measurements: 0.4cm length x 0.5cm width x 0.5cm depth; 0.079cm^3 volume. Post debridement Stage noted as Category/Stage III. The undermined area is still got some depth to it and we'll continue packing with the iodoform gauze. Plan Wound Cleansing: Wound #1 Left Calcaneous: Clean wound with Normal Saline. Anesthetic: Wound #1 Left Calcaneous: Topical Lidocaine 4% cream applied to wound bed prior to debridement Tracy Le, Tracy Le ([id number]) Primary Wound Dressing: Wound #1 Left Calcaneous: Iodoform packing Gauze Secondary Dressing: Wound #1 Left Calcaneous: ABD and Kerlix/Conform Dressing Change Frequency: Wound #1 Left Calcaneous: Change dressing every other day. Follow-up Appointments: Wound #1 Left Calcaneous: Return Appointment in 1 week. Off-Loading: Wound #1 Left Calcaneous: Turn and reposition every 2 hours Additional Orders / Instructions: Wound #1 Left Calcaneous: Increase protein intake. Activity as tolerated Other: - sage boots The ulcerated area on the left heel is undermining between the 12 and 3:00 position and this is about a centimeter in depth. We'll continue packing it lightly with iodoform gauze and see her back next week. Discussed the dressing details with her daughter-in-law who is her caregiver. Electronic Signature(s) Signed: 06/20/2014 5:41:46 PM By: Evlyn Kanner MD, FACS Entered By: Evlyn Kanner on 06/20/2014 17:12:39 Tracy Le, Tracy Le ([id number]) -------------------------------------------------------------------------------- SuperBill Details Patient Name: Tracy Le Date of Service: 06/20/2014 Medical Record Number: [id number] Patient Account Number: [id number] Date of Birth/Sex: 01-08-1914 (81 y.o. Female) Treating RN: Kristin Bruins Primary Care Physician: Corky Downs Other Clinician: Referring Physician: Corky Downs Treating Physician/Extender: Rudene Re in Treatment: 20 Diagnosis Coding ICD-10 Codes Code Description [phone] Pressure ulcer of left heel, stage 3 L89.312 Pressure ulcer of right buttock, stage 2 Facility Procedures CPT4 Code: [id number] Description: 97597 - DEBRIDE WOUND 1ST 20 SQ CM OR < ICD-10 Description Diagnosis L89.623 Pressure ulcer of left heel, stage 3 Modifier: Quantity: 1 Physician Procedures CPT4 Code: [id number] Description: 97597 - WC PHYS DEBR WO ANESTH 20 SQ CM ICD-10 Description Diagnosis L89.623 Pressure ulcer of left heel, stage 3 Modifier: Quantity: 1 Electronic Signature(s) Signed: 06/20/2014 5:41:46 PM By: Evlyn Kanner MD, FACS Entered By: Evlyn Kanner on 06/20/2014  17:13:14 

## 2014-06-21 NOTE — Progress Notes (Signed)
Tracy Le, Gaelyn ([id number]) Visit Report for 06/20/2014 Arrival Information Details Patient Name: Tracy Le, Tracy Le Date of Service: 06/20/2014 4:00 PM Medical Record Number: [id number] Patient Account Number: [id number] Date of Birth/Sex: [birth year] (81 y.o. Female) Treating RN: Renee HarderMabry, Kelsey Primary Care Physician: Corky DownsMASOUD, JAVED Other Clinician: Referring Physician: Corky DownsMASOUD, JAVED Treating Physician/Extender: Rudene ReBritto, Errol Weeks in Treatment: 20 Visit Information History Since Last Visit Added or deleted any medications: No Patient Arrived: Wheel Chair Any new allergies or adverse reactions: No Arrival Time: 16:26 Had a fall or experienced change in No Accompanied By: DIL activities of daily living that may affect Transfer Assistance: Manual risk of falls: Patient Identification Verified: Yes Signs or symptoms of abuse/neglect since last No Secondary Verification Process Yes visito Completed: Hospitalized since last visit: No Patient Has Alerts: Yes Has Dressing in Place as Prescribed: Yes Patient Alerts: 1/16 ABI L: Pain Present Now: No 1.23 Electronic Signature(s) Signed: 06/20/2014 5:11:50 PM By: Kristin Bruinsanneker, Nancy Entered By: Kristin Bruinsanneker, Nancy on 06/20/2014 16:40:09 Tracy Le, Tracy Le ([id number]) -------------------------------------------------------------------------------- Encounter Discharge Information Details Patient Name: Tracy Le, Tracy Le Date of Service: 06/20/2014 4:00 PM Medical Record Number: [id number] Patient Account Number: [id number] Date of Birth/Sex: [birth year] (81 y.o. Female) Treating RN: Kristin Bruinsanneker, Nancy Primary Care Physician: Corky DownsMASOUD, JAVED Other Clinician: Referring Physician: Corky DownsMASOUD, JAVED Treating Physician/Extender: Rudene ReBritto, Errol Weeks in Treatment: 20 Encounter Discharge Information Items Discharge Pain Level: 0 Discharge Condition: Stable Ambulatory Status: Wheelchair Discharge Destination: Home Transportation: Private Auto Accompanied By: daughter Schedule  Follow-up Appointment: Yes Medication Reconciliation completed and provided to Patient/Care No Tracy Le: Provided on Clinical Summary of Care: 06/20/2014 Form Type Recipient Paper Patient LL Electronic Signature(s) Signed: 06/20/2014 5:11:50 PM By: Kristin Bruinsanneker, Nancy Previous Signature: 06/20/2014 4:57:30 PM Version By: Gwenlyn PerkingMoore, Shelia Entered By: Kristin Bruinsanneker, Nancy on 06/20/2014 17:08:57 Tracy Le, Tracy Le ([id number]) -------------------------------------------------------------------------------- Lower Extremity Assessment Details Patient Name: Tracy Le, Tracy Le Date of Service: 06/20/2014 4:00 PM Medical Record Number: [id number] Patient Account Number: [id number] Date of Birth/Sex: [birth year] (81 y.o. Female) Treating RN: Renee HarderMabry, Kelsey Primary Care Physician: Corky DownsMASOUD, JAVED Other Clinician: Referring Physician: Corky DownsMASOUD, JAVED Treating Physician/Extender: Rudene ReBritto, Errol Weeks in Treatment: 20 Vascular Assessment Pulses: Posterior Tibial Palpable: [Left:Yes] Doppler: [Left:Multiphasic] Dorsalis Pedis Palpable: [Left:Yes] Doppler: [Left:Multiphasic] Extremity colors, hair growth, and conditions: Extremity Color: [Left:Hyperpigmented] Hair Growth on Extremity: [Left:No] Temperature of Extremity: [Left:Warm] Capillary Refill: [Left:< 3 seconds] Dependent Rubor: [Left:No] Blanched when Elevated: [Left:No] Lipodermatosclerosis: [Left:No] Toe Nail Assessment Left: Right: Thick: Yes Discolored: Yes Deformed: Yes Improper Length and Hygiene: No Electronic Signature(s) Signed: 06/20/2014 5:11:50 PM By: Kristin Bruinsanneker, Nancy Signed: 06/20/2014 5:30:05 PM By: Renee HarderMabry, Kelsey RN Entered By: Kristin Bruinsanneker, Nancy on 06/20/2014 16:40:36 Tracy Le, Tracy Le ([id number]) -------------------------------------------------------------------------------- Multi Wound Chart Details Patient Name: Tracy Le, Tracy Le Date of Service: 06/20/2014 4:00 PM Medical Record Number: [id number] Patient Account Number: [id number] Date of Birth/Sex:  [birth year] (81 y.o. Female) Treating RN: Renee HarderMabry, Kelsey Primary Care Physician: Corky DownsMASOUD, JAVED Other Clinician: Referring Physician: Corky DownsMASOUD, JAVED Treating Physician/Extender: Rudene ReBritto, Errol Weeks in Treatment: 20 Vital Signs Height(in): 69 Pulse(bpm): 75 Weight(lbs): 189 Blood Pressure 118/59 (mmHg): Body Mass Index(BMI): 28 Temperature(F): 98.2 Respiratory Rate 12 (breaths/min): Photos: [1:No Photos] [N/A:N/A] Wound Location: [1:Left Calcaneous] [N/A:N/A] Wounding Event: [1:Pressure Injury] [N/A:N/A] Primary Etiology: [1:Pressure Ulcer] [N/A:N/A] Comorbid History: [1:History of pressure wounds, Osteoarthritis, Dementia] [N/A:N/A] Date Acquired: [1:12/31/2013] [N/A:N/A] Weeks of Treatment: [1:20] [N/A:N/A] Wound Status: [1:Open] [N/A:N/A] Measurements L x W x D 0.4x0.5x0.5 [N/A:N/A] (cm) Area (cm) : [1:0.157] [N/A:N/A] Volume (cm) : [1:0.079] [N/A:N/A] % Reduction in Area: [1:98.20%] [N/A:N/A] % Reduction in Volume: 91.00% [N/A:N/A] Classification: [1:Category/Stage III] [N/A:N/A] Exudate Amount: [1:Small] [  N/A:N/A] Exudate Type: [1:Serous] [N/A:N/A] Exudate Color: [1:amber] [N/A:N/A] Wound Margin: [1:Distinct, outline attached] [N/A:N/A] Granulation Amount: [1:Large (67-100%)] [N/A:N/A] Granulation Quality: [1:Red] [N/A:N/A] Necrotic Amount: [1:Small (1-33%)] [N/A:N/A] Exposed Structures: [1:Fascia: No Fat: No Tendon: No Muscle: No Joint: No Bone: No] [N/A:N/A] Limited to Skin Breakdown Epithelialization: Small (1-33%) N/A N/A Periwound Skin Texture: Edema: No N/A N/A Excoriation: No Induration: No Callus: No Crepitus: No Fluctuance: No Friable: No Rash: No Scarring: No Periwound Skin Moist: Yes N/A N/A Moisture: Maceration: No Dry/Scaly: No Periwound Skin Color: Atrophie Blanche: No N/A N/A Cyanosis: No Ecchymosis: No Erythema: No Hemosiderin Staining: No Mottled: No Pallor: No Rubor: No Temperature: No Abnormality N/A N/A Tenderness on No N/A  N/A Palpation: Wound Preparation: Ulcer Cleansing: N/A N/A Rinsed/Irrigated with Saline Topical Anesthetic Applied: Other: Lidocaine 4% Ointment Treatment Notes Electronic Signature(s) Signed: 06/20/2014 5:30:05 PM By: Renee Harder RN Entered By: Renee Harder on 06/20/2014 16:46:29 Tracy Le, Tracy Le ([id number]) -------------------------------------------------------------------------------- Multi-Disciplinary Care Plan Details Patient Name: Tracy Le Date of Service: 06/20/2014 4:00 PM Medical Record Number: [id number] Patient Account Number: [id number] Date of Birth/Sex: July 28, 1913 (81 y.o. Female) Treating RN: Renee Harder Primary Care Physician: Corky Downs Other Clinician: Referring Physician: Corky Downs Treating Physician/Extender: Rudene Re in Treatment: 20 Active Inactive Abuse / Safety / Falls / Self Care Management Nursing Diagnoses: Potential for falls Goals: Patient will remain injury free Date Initiated: 01/31/2014 Goal Status: Active Interventions: Assess fall risk on admission and as needed Notes: Nutrition Nursing Diagnoses: Potential for alteratiion in Nutrition/Potential for imbalanced nutrition Goals: Patient/caregiver agrees to and verbalizes understanding of need to use nutritional supplements and/or vitamins as prescribed Date Initiated: 01/31/2014 Goal Status: Active Interventions: Assess patient nutrition upon admission and as needed per policy Notes: Pressure Nursing Diagnoses: Potential for impaired tissue integrity related to pressure, friction, moisture, and shear Goals: Patient will remain free from development of additional pressure ulcers Zentz, Jupiter ([id number]) Date Initiated: 01/31/2014 Goal Status: Active Interventions: Assess potential for pressure ulcer upon admission and as needed Notes: Wound/Skin Impairment Nursing Diagnoses: Impaired tissue integrity Goals: Ulcer/skin breakdown will have a volume reduction of  30% by week 4 Date Initiated: 01/31/2014 Goal Status: Active Interventions: Assess ulceration(s) every visit Notes: Electronic Signature(s) Signed: 06/20/2014 5:30:05 PM By: Renee Harder RN Entered By: Renee Harder on 06/20/2014 16:46:22 Tracy Le, Tracy Le ([id number]) -------------------------------------------------------------------------------- Pain Assessment Details Patient Name: Tracy Le Date of Service: 06/20/2014 4:00 PM Medical Record Number: [id number] Patient Account Number: [id number] Date of Birth/Sex: October 10, 1913 (81 y.o. Female) Treating RN: Renee Harder Primary Care Physician: Corky Downs Other Clinician: Referring Physician: Corky Downs Treating Physician/Extender: Rudene Re in Treatment: 20 Active Problems Location of Pain Severity and Description of Pain Patient Has Paino No Site Locations Pain Management and Medication Current Pain Management: Electronic Signature(s) Signed: 06/20/2014 5:11:50 PM By: Kristin Bruins Signed: 06/20/2014 5:30:05 PM By: Renee Harder RN Entered By: Kristin Bruins on 06/20/2014 16:40:14 Tracy Le ([id number]) -------------------------------------------------------------------------------- Patient/Caregiver Education Details Patient Name: Tracy Le Date of Service: 06/20/2014 4:00 PM Medical Record Number: [id number] Patient Account Number: [id number] Date of Birth/Gender: Jul 10, 1913 (81 y.o. Female) Treating RN: Renee Harder Primary Care Physician: Corky Downs Other Clinician: Referring Physician: Corky Downs Treating Physician/Extender: Rudene Re in Treatment: 20 Education Assessment Education Provided To: Patient Education Topics Provided Offloading: Methods: Explain/Verbal Responses: State content correctly Pressure: Methods: Explain/Verbal Responses: State content correctly Safety: Methods: Explain/Verbal Responses: State content correctly Wound Debridement: Methods:  Explain/Verbal Responses: State content correctly Wound/Skin Impairment: Handouts: Caring for Your Ulcer Methods: Explain/Verbal Responses:  State content correctly Electronic Signature(s) Signed: 06/20/2014 5:11:50 PM By: Kristin Bruins Entered By: Kristin Bruins on 06/20/2014 17:09:14 Tracy Le ([id number]) -------------------------------------------------------------------------------- Wound Assessment Details Patient Name: Tracy Le Date of Service: 06/20/2014 4:00 PM Medical Record Number: [id number] Patient Account Number: [id number] Date of Birth/Sex: Feb 10, 1913 (81 y.o. Female) Treating RN: Renee Harder Primary Care Physician: Corky Downs Other Clinician: Referring Physician: Corky Downs Treating Physician/Extender: Rudene Re in Treatment: 20 Wound Status Wound Number: 1 Primary Pressure Ulcer Etiology: Wound Location: Left Calcaneous Wound Open Wounding Event: Pressure Injury Status: Date Acquired: 12/31/2013 Comorbid History of pressure wounds, Weeks Of Treatment: 20 History: Osteoarthritis, Dementia Clustered Wound: No Photos Photo Uploaded By: Renee Harder on 06/20/2014 17:26:44 Wound Measurements Length: (cm) 0.4 Width: (cm) 0.5 Depth: (cm) 0.5 Area: (cm) 0.157 Volume: (cm) 0.079 % Reduction in Area: 98.2% % Reduction in Volume: 91% Epithelialization: Small (1-33%) Tunneling: No Undermining: Yes Starting Position (o'clock): 12 Ending Position (o'clock): 3 Maximum Distance: (cm) 1 Wound Description Classification: Category/Stage III Wound Margin: Distinct, outline attached Exudate Amount: Small Exudate Type: Serous Exudate Color: amber Foul Odor After Cleansing: No Wound Bed Tracy Le, Tracy Le ([id number]) Granulation Amount: Large (67-100%) Exposed Structure Granulation Quality: Red Fascia Exposed: No Necrotic Amount: Small (1-33%) Fat Layer Exposed: No Necrotic Quality: Adherent Slough Tendon Exposed: No Muscle Exposed:  No Joint Exposed: No Bone Exposed: No Limited to Skin Breakdown Periwound Skin Texture Texture Color No Abnormalities Noted: No No Abnormalities Noted: No Callus: No Atrophie Blanche: No Crepitus: No Cyanosis: No Excoriation: No Ecchymosis: No Fluctuance: No Erythema: No Friable: No Hemosiderin Staining: No Induration: No Mottled: No Localized Edema: No Pallor: No Rash: No Rubor: No Scarring: No Temperature / Pain Moisture Temperature: No Abnormality No Abnormalities Noted: No Dry / Scaly: No Maceration: No Moist: Yes Wound Preparation Ulcer Cleansing: Rinsed/Irrigated with Saline Topical Anesthetic Applied: Other: Lidocaine 4% Ointment, Treatment Notes Wound #1 (Left Calcaneous) 1. Cleansed with: Clean wound with Normal Saline 2. Anesthetic Topical Lidocaine 4% cream to wound bed prior to debridement 4. Dressing Applied: Iodoform packing Gauze 5. Secondary Dressing Applied Kerlix/Conform Electronic Signature(s) Signed: 06/20/2014 5:30:05 PM By: Renee Harder RN Entered By: Renee Harder on 06/20/2014 16:49:09 Tracy Le, Tracy Le ([id number]) Tracy Le, Tracy Le ([id number]) -------------------------------------------------------------------------------- Vitals Details Patient Name: Tracy Le Date of Service: 06/20/2014 4:00 PM Medical Record Number: [id number] Patient Account Number: [id number] Date of Birth/Sex: 1913/06/29 (81 y.o. Female) Treating RN: Renee Harder Primary Care Physician: Corky Downs Other Clinician: Referring Physician: Corky Downs Treating Physician/Extender: Rudene Re in Treatment: 20 Vital Signs Time Taken: 16:25 Temperature (F): 98.2 Height (in): 69 Pulse (bpm): 75 Weight (lbs): 189 Respiratory Rate (breaths/min): 12 Body Mass Index (BMI): 27.9 Blood Pressure (mmHg): 118/59 Reference Range: 80 - 120 mg / dl Electronic Signature(s) Signed: 06/20/2014 5:11:50 PM By: Kristin Bruins Entered By: Kristin Bruins on 06/20/2014  16:40:19

## 2014-07-04 ENCOUNTER — Encounter: Payer: Medicare Other | Attending: Surgery | Admitting: Surgery

## 2014-07-04 DIAGNOSIS — L89623 Pressure ulcer of left heel, stage 3: Secondary | ICD-10-CM | POA: Insufficient documentation

## 2014-07-04 DIAGNOSIS — L89312 Pressure ulcer of right buttock, stage 2: Secondary | ICD-10-CM | POA: Diagnosis not present

## 2014-07-05 NOTE — Progress Notes (Signed)
AERYN, MEDICI ([id number]) Visit Report for 07/04/2014 Chief Complaint Document Details Patient Name: Tracy Le, Tracy Le Date of Service: 07/04/2014 3:45 PM Medical Record Number: [id number] Patient Account Number: [id number] Date of Birth/Sex: [birth year] (81 y.o. 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 She has no fresh complaints. she does not walk around much except transferring from a chair to the bed. Electronic Signature(s) Signed: 07/04/2014 4:42:08 PM By: Evlyn Kanner MD, FACS Entered By: Evlyn Kanner on 07/04/2014 16:29:35 Tracy Le, Tracy Le ([id number]) -------------------------------------------------------------------------------- HPI Details Patient Name: Tracy Le Date of Service: 07/04/2014 3:45 PM Medical Record Number: [id number] Patient Account Number: [id number] Date of Birth/Sex: [birth year] (81 y.o. Female) Treating RN: Primary Care Physician: Corky Downs Other Clinician: Referring Physician: Corky Downs Treating Physician/Extender: Rudene Re in Treatment: 22 History of Present Illness Location: left calcaneus Quality: Patient reports No Pain. Severity: Patient states wound (s) are getting better. Duration: Patient states that they are not certain how long the wound has been present. Timing: nil Context: The wound appeared gradually over time Modifying Factors: sitting most of the day Associated Signs and Symptoms: Patient reports having difficulty standing for long periods. HPI Description: The patient is an 81 year old female with a treatment history of a stage III left calcaneal wound. The patient is mostly bedbound at home. She does spend some time sitting up  in a chair. She does not have an air mattress at home. Her caretaker has been using triple antibiotic on the bone itself. She denies any fevers at home. The caretaker denies any history of diabetes or vascular disease. Since being seen here here in the wound clinic she has been applying Santyl to the ulcer base. the patient returns for follow-up today. They have been compliant with dressing changes. 03/29/14 -- the dressings have been done on a daily basis according to plan and the daughter says that she is running out of Santyl ointment and would need a new prescription. 04/26/2014 patient's daughter-in-law is comfortable doing the dressing and after discussing with her it is okay if the home health stops going to do the dressing once a week. 03/29/14 - she seems to be doing fine and has no fresh issues. The daughter has been doing her dressings on a regular basis and has been doing them according to the plan. 05/09/2014 the daughter noticed a small ulcer on the right buttock medially which is tiny and a stage II pressure ulcer. Electronic Signature(s) Signed: 07/04/2014 4:42:08 PM By: Evlyn Kanner MD, FACS Entered By: Evlyn Kanner on 07/04/2014 16:29:48 Tracy Le, Tracy Le ([id number]) -------------------------------------------------------------------------------- Physical Exam Details Patient Name: Tracy Le Date of Service: 07/04/2014 3:45 PM Medical Record Number: [id number] Patient Account Number: [id number] Date of Birth/Sex: 08/18/1913 (81 y.o. Female) Treating RN: Primary Care Physician: Corky Downs Other Clinician: Referring Physician: Corky Downs Treating Physician/Extender: Rudene Re in Treatment: 22 Constitutional . Pulse regular. Respirations normal and unlabored. Afebrile. . Eyes Nonicteric. Reactive to light. Ears, Nose, Mouth, and Throat Lips, teeth, and gums WNL.Marland Kitchen Moist mucosa without lesions . Neck supple and nontender. No palpable supraclavicular or cervical  adenopathy. Normal sized without goiter. Respiratory WNL. No retractions.. Cardiovascular weak palpable pulses at the ankle. the second +2 pitting edema lower extremities bilaterally. Integumentary (Hair, Skin) the ulceration on the left  heel is clean and the opening undermines approximately between the 6 to 2:00 position.. No crepitus or fluctuance. No peri-wound warmth or erythema. No masses.Marland Kitchen Psychiatric Judgement and insight Intact.. No evidence of depression, anxiety, or agitation.. Electronic Signature(s) Signed: 07/04/2014 4:42:08 PM By: Evlyn Kanner MD, FACS Entered By: Evlyn Kanner on 07/04/2014 16:30:55 Tracy Le, Tracy Le ([id number]) -------------------------------------------------------------------------------- Physician Orders Details Patient Name: Tracy Le Date of Service: 07/04/2014 3:45 PM Medical Record Number: [id number] Patient Account Number: [id number] Date of Birth/Sex: 02-11-1913 (81 y.o. Female) Treating RN: Curtis Sites Primary Care Physician: Corky Downs Other Clinician: Referring Physician: Corky Downs Treating Physician/Extender: Rudene Re in Treatment: 22 Verbal / Phone Orders: Yes Clinician: Curtis Sites Read Back and Verified: Yes Diagnosis Coding Wound Cleansing Wound #1 Left Calcaneous o Clean wound with Normal Saline. Anesthetic Wound #1 Left Calcaneous o Topical Lidocaine 4% cream applied to wound bed prior to debridement Primary Wound Dressing Wound #1 Left Calcaneous o Iodoform packing Gauze Secondary Dressing Wound #1 Left Calcaneous o ABD and Kerlix/Conform Dressing Change Frequency Wound #1 Left Calcaneous o Change dressing every other day. Follow-up Appointments Wound #1 Left Calcaneous o Return Appointment in 1 week. Off-Loading Wound #1 Left Calcaneous o Turn and reposition every 2 hours Additional Orders / Instructions Wound #1 Left Calcaneous o Increase protein intake. o Activity as  tolerated o Other: - sage boots [place], Tekia ([id number]) Electronic Signature(s) Signed: 07/04/2014 4:42:08 PM By: Evlyn Kanner MD, FACS Signed: 07/04/2014 5:11:42 PM By: Curtis Sites Entered By: Curtis Sites on 07/04/2014 16:27:17 Tracy Le, Tracy Le ([id number]) -------------------------------------------------------------------------------- Problem List Details Patient Name: Tracy Le Date of Service: 07/04/2014 3:45 PM Medical Record Number: [id number] Patient Account Number: [id number] Date of Birth/Sex: 12/10/1913 (81 y.o. Female) Treating RN: Primary Care Physician: Corky Downs Other Clinician: Referring Physician: Corky Downs Treating Physician/Extender: Rudene Re in Treatment: 22 Active Problems ICD-10 Encounter Code Description Active Date Diagnosis L89.623 Pressure ulcer of left heel, stage 3 01/31/2014 Yes L89.312 Pressure ulcer of right buttock, stage 2 05/09/2014 Yes Inactive Problems Resolved Problems Electronic Signature(s) Signed: 07/04/2014 4:42:08 PM By: Evlyn Kanner MD, FACS Entered By: Evlyn Kanner on 07/04/2014 16:29:27 Tracy Le ([id number]) -------------------------------------------------------------------------------- Progress Note Details Patient Name: Tracy Le Date of Service: 07/04/2014 3:45 PM Medical Record Number: [id number] Patient Account Number: [id number] Date of Birth/Sex: 01-27-1914 (81 y.o. 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 She has no fresh complaints. she does not walk around much except transferring from a chair to the bed. History of Present Illness (HPI) The following HPI elements were documented for the  patient's wound: Location: left calcaneus Quality: Patient reports No Pain. Severity: Patient states wound (s) are getting better. Duration: Patient states that they are not certain how long the wound has been present. Timing: nil Context: The wound appeared gradually over time Modifying Factors: sitting most of the day Associated Signs and Symptoms: Patient reports having difficulty standing for long periods. The patient is an 81 year old female with a treatment history of a stage III left calcaneal wound. The patient is mostly bedbound at home. She does spend some time sitting up in a chair. She does not have an air mattress at home. Her caretaker has been using triple antibiotic on the bone itself. She denies any  fevers at home. The caretaker denies any history of diabetes or vascular disease. Since being seen here here in the wound clinic she has been applying Santyl to the ulcer base. the patient returns for follow-up today. They have been compliant with dressing changes. 03/29/14 -- the dressings have been done on a daily basis according to plan and the daughter says that she is running out of Santyl ointment and would need a new prescription. 04/26/2014 patient's daughter-in-law is comfortable doing the dressing and after discussing with her it is okay if the home health stops going to do the dressing once a week. 03/29/14 - she seems to be doing fine and has no fresh issues. The daughter has been doing her dressings on a regular basis and has been doing them according to the plan. 05/09/2014 the daughter noticed a small ulcer on the right buttock medially which is tiny and a stage II pressure ulcer. Tracy OsierLLOYD, Tracy Le ([id number]) Objective Constitutional Pulse regular. Respirations normal and unlabored. Afebrile. Vitals Time Taken: 4:02 AM, Height: 69 in, Weight: 189 lbs, BMI: 27.9, Temperature: 98.1 F, Pulse: 80 bpm, Respiratory Rate: 16 breaths/min, Blood Pressure: 110/83  mmHg. Eyes Nonicteric. Reactive to light. Ears, Nose, Mouth, and Throat Lips, teeth, and gums WNL.Marland Kitchen. Moist mucosa without lesions . Neck supple and nontender. No palpable supraclavicular or cervical adenopathy. Normal sized without goiter. Respiratory WNL. No retractions.. Cardiovascular weak palpable pulses at the ankle. the second +2 pitting edema lower extremities bilaterally. Psychiatric Judgement and insight Intact.. No evidence of depression, anxiety, or agitation.. Integumentary (Hair, Skin) the ulceration on the left heel is clean and the opening undermines approximately between the 6 to 2:00 position.. No crepitus or fluctuance. No peri-wound warmth or erythema. No masses.. Wound #1 status is Open. Original cause of wound was Pressure Injury. The wound is located on the Left Calcaneous. The wound measures 0.4cm length x 0.5cm width x 0.7cm depth; 0.157cm^2 area and 0.11cm^3 volume. The wound is limited to skin breakdown. There is no tunneling noted, however, there is undermining starting at 5:00 and ending at 10:00 with a maximum distance of 0.9cm. There is a small amount of serous drainage noted. The wound margin is distinct with the outline attached to the wound base. There is large (67-100%) red granulation within the wound bed. There is a small (1-33%) amount of necrotic tissue within the wound bed including Adherent Slough. The periwound skin appearance exhibited: Moist. The periwound skin appearance did not exhibit: Callus, Crepitus, Excoriation, Fluctuance, Friable, Induration, Localized Edema, Rash, Scarring, Dry/Scaly, Maceration, Atrophie Blanche, Cyanosis, Ecchymosis, Hemosiderin Staining, Mottled, Pallor, Rubor, Erythema. Periwound temperature was noted as No Abnormality. Tracy OsierLLOYD, Tracy Le ([id number]) Assessment Active Problems ICD-10 [id number] - Pressure ulcer of left heel, stage 3 L89.312 - Pressure ulcer of right buttock, stage 2 I have asked her daughter-in-law to  continue with light packing with iodoform strips. He continues to offload as well as possible and I also asked her to raise her feet on some pillows to help with decreasing edema. I will see her back next week. Plan Wound Cleansing: Wound #1 Left Calcaneous: Clean wound with Normal Saline. Anesthetic: Wound #1 Left Calcaneous: Topical Lidocaine 4% cream applied to wound bed prior to debridement Primary Wound Dressing: Wound #1 Left Calcaneous: Iodoform packing Gauze Secondary Dressing: Wound #1 Left Calcaneous: ABD and Kerlix/Conform Dressing Change Frequency: Wound #1 Left Calcaneous: Change dressing every other day. Follow-up Appointments: Wound #1 Left Calcaneous: Return Appointment in 1 week. Off-Loading: Wound #1 Left Calcaneous: Turn and  reposition every 2 hours Additional Orders / Instructions: Wound #1 Left Calcaneous: Increase protein intake. Activity as tolerated Tracy Le, Tracy Le ([id number]) Other: - sage boots I have asked her daughter-in-law to continue with light packing with iodoform strips. He continues to offload as well as possible and I also asked her to raise her feet on some pillows to help with decreasing edema. I will see her back next week. Electronic Signature(s) Signed: 07/04/2014 4:42:08 PM By: Evlyn Kanner MD, FACS Entered By: Evlyn Kanner on 07/04/2014 16:31:55 Tracy Le, Tracy Le ([id number]) -------------------------------------------------------------------------------- SuperBill Details Patient Name: Tracy Le Date of Service: 07/04/2014 Medical Record Number: [id number] Patient Account Number: [id number] Date of Birth/Sex: Mar 28, 1913 (81 y.o. Female) Treating RN: Primary Care Physician: Corky Downs Other Clinician: Referring Physician: Corky Downs Treating Physician/Extender: Rudene Re in Treatment: 22 Diagnosis Coding ICD-10 Codes Code Description [phone] Pressure ulcer of left heel, stage 3 L89.312 Pressure ulcer of right  buttock, stage 2 Facility Procedures CPT4 Code: [id number] Description: 99213 - WOUND CARE VISIT-LEV 3 EST PT Modifier: Quantity: 1 Physician Procedures CPT4 Code: [id number] Description: 99213 - WC PHYS LEVEL 3 - EST PT ICD-10 Description Diagnosis L89.623 Pressure ulcer of left heel, stage 3 Modifier: Quantity: 1 Electronic Signature(s) Signed: 07/04/2014 4:45:19 PM By: Curtis Sites Previous Signature: 07/04/2014 4:42:08 PM Version By: Evlyn Kanner MD, FACS Entered By: Curtis Sites on 07/04/2014 16:45:19

## 2014-07-05 NOTE — Progress Notes (Signed)
Tracy Le, Tracy Le ([id number]) Visit Report for 07/04/2014 Arrival Information Details Patient Name: Tracy Le, Tracy Le Date of Service: 07/04/2014 3:45 PM Medical Record Number: [id number] Patient Account Number: [id number] Date of Birth/Sex: April 04, 1913 (81 y.o. Female) Treating RN: Huel Coventry Primary Care Physician: Corky Downs Other Clinician: Referring Physician: Corky Downs Treating Physician/Extender: Rudene Re in Treatment: 22 Visit Information History Since Last Visit Added or deleted any medications: No Patient Arrived: Wheel Chair Any new allergies or adverse reactions: No Arrival Time: 15:58 Had a fall or experienced change in No Accompanied By: daughter and activities of daily living that may affect daugher in law risk of falls: Transfer Assistance: Manual Signs or symptoms of abuse/neglect since last No Patient Identification Verified: Yes visito Secondary Verification Yes Hospitalized since last visit: No Process Completed: Has Dressing in Place as Prescribed: Yes Patient Has Alerts: Yes Pain Present Now: No Patient Alerts: 1/16 ABI L: 1.23 Electronic Signature(s) Signed: 07/04/2014 5:16:59 PM By: Elliot Gurney, RN, BSN, Kim RN, BSN Entered By: Elliot Gurney, RN, BSN, Kim on 07/04/2014 16:01:58 Tracy Le ([id number]) -------------------------------------------------------------------------------- Clinic Level of Care Assessment Details Patient Name: Tracy Le Date of Service: 07/04/2014 3:45 PM Medical Record Number: [id number] Patient Account Number: [id number] Date of Birth/Sex: 1913-08-07 (81 y.o. Female) Treating RN: Curtis Sites Primary Care Physician: Corky Downs Other Clinician: Referring Physician: Corky Downs Treating Physician/Extender: Rudene Re in Treatment: 22 Clinic Level of Care Assessment Items TOOL 4 Quantity Score  - Use when only an EandM is performed on FOLLOW-UP visit 0 ASSESSMENTS - Nursing Assessment / Reassessment X - Reassessment  of Co-morbidities (includes updates in patient status) 1 10 X - Reassessment of Adherence to Treatment Plan 1 5 ASSESSMENTS - Wound and Skin Assessment / Reassessment X - Simple Wound Assessment / Reassessment - one wound 1 5  - Complex Wound Assessment / Reassessment - multiple wounds 0  - Dermatologic / Skin Assessment (not related to wound area) 0 ASSESSMENTS - Focused Assessment X - Circumferential Edema Measurements - multi extremities 1 5  - Nutritional Assessment / Counseling / Intervention 0 X - Lower Extremity Assessment (monofilament, tuning fork, pulses) 1 5  - Peripheral Arterial Disease Assessment (using hand held doppler) 0 ASSESSMENTS - Ostomy and/or Continence Assessment and Care  - Incontinence Assessment and Management 0  - Ostomy Care Assessment and Management (repouching, etc.) 0 PROCESS - Coordination of Care X - Simple Patient / Family Education for ongoing care 1 15  - Complex (extensive) Patient / Family Education for ongoing care 0  - Staff obtains Chiropractor, Records, Test Results / Process Orders 0  - Staff telephones HHA, Nursing Homes / Clarify orders / etc 0  - Routine Transfer to another Facility (non-emergent condition) 0 [place], Sharanya ([id number])  - Routine Hospital Admission (non-emergent condition) 0  - New Admissions / Manufacturing engineer / Ordering NPWT, Apligraf, etc. 0  - Emergency Hospital Admission (emergent condition) 0 X - Simple Discharge Coordination 1 10  - Complex (extensive) Discharge Coordination 0 PROCESS - Special Needs  - Pediatric / Minor Patient Management 0  - Isolation Patient Management 0  - Hearing / Language / Visual special needs 0  - Assessment of Community assistance (transportation, D/C planning, etc.) 0  - Additional assistance / Altered mentation 0  - Support Surface(s) Assessment (bed, cushion, seat, etc.) 0 INTERVENTIONS - Wound Cleansing / Measurement X - Simple Wound  Cleansing - one wound 1 5  - Complex Wound Cleansing - multiple wounds 0 X - Wound Imaging (photographs -  any number of wounds) 1 5 []  - Wound Tracing (instead of photographs) 0 X - Simple Wound Measurement - one wound 1 5 []  - Complex Wound Measurement - multiple wounds 0 INTERVENTIONS - Wound Dressings X - Small Wound Dressing one or multiple wounds 1 10 []  - Medium Wound Dressing one or multiple wounds 0 []  - Large Wound Dressing one or multiple wounds 0 []  - Application of Medications - topical 0 []  - Application of Medications - injection 0 INTERVENTIONS - Miscellaneous []  - External ear exam 0 [place], Elyanna ([id number]) []  - Specimen Collection (cultures, biopsies, blood, body fluids, etc.) 0 []  - Specimen(s) / Culture(s) sent or taken to Lab for analysis 0 []  - Patient Transfer (multiple staff / Michiel Sites Lift / Similar devices) 0 []  - Simple Staple / Suture removal (25 or less) 0 []  - Complex Staple / Suture removal (26 or more) 0 []  - Hypo / Hyperglycemic Management (close monitor of Blood Glucose) 0 []  - Ankle / Brachial Index (ABI) - do not check if billed separately 0 X - Vital Signs 1 5 Has the patient been seen at the hospital within the last three years: Yes Total Score: 85 Level Of Care: New/Established - Level 3 Electronic Signature(s) Signed: 07/04/2014 4:45:05 PM By: Curtis Sites Entered By: Curtis Sites on 07/04/2014 16:45:05 Tracy Le ([id number]) -------------------------------------------------------------------------------- Encounter Discharge Information Details Patient Name: Tracy Le Date of Service: 07/04/2014 3:45 PM Medical Record Number: [id number] Patient Account Number: [id number] Date of Birth/Sex: 1913/06/07 (81 y.o. Female) Treating RN: Primary Care Physician: Corky Downs Other Clinician: Referring Physician: Corky Downs Treating Physician/Extender: Rudene Re in Treatment: 22 Encounter Discharge Information Items Discharge Pain  Level: 0 Discharge Condition: Stable Ambulatory Status: Wheelchair Discharge Destination: Home Transportation: Private Auto daughter in law Accompanied By: and son Schedule Follow-up Appointment: Yes Medication Reconciliation completed and provided to Patient/Care Yes Shanice Poznanski: Provided on Clinical Summary of Care: 07/04/2014 Form Type Recipient Paper Patient LL Electronic Signature(s) Signed: 07/04/2014 5:16:59 PM By: Elliot Gurney, RN, BSN, Kim RN, BSN Previous Signature: 07/04/2014 4:33:47 PM Version By: Francie Massing Entered By: Elliot Gurney RN, BSN, Kim on 07/04/2014 16:35:12 Tracy Le ([id number]) -------------------------------------------------------------------------------- Lower Extremity Assessment Details Patient Name: Tracy Le Date of Service: 07/04/2014 3:45 PM Medical Record Number: [id number] Patient Account Number: [id number] Date of Birth/Sex: Apr 24, 1913 (81 y.o. Female) Treating RN: Huel Coventry Primary Care Physician: Corky Downs Other Clinician: Referring Physician: Corky Downs Treating Physician/Extender: Rudene Re in Treatment: 22 Vascular Assessment Pulses: Posterior Tibial Palpable: [Left:Yes] Doppler: [Left:Monophasic] Dorsalis Pedis Palpable: [Left:Yes] Doppler: [Left:Monophasic] Extremity colors, hair growth, and conditions: Extremity Color: [Left:Hyperpigmented] Hair Growth on Extremity: [Left:No] Temperature of Extremity: [Left:Warm] Capillary Refill: [Left:> 3 seconds] Toe Nail Assessment Left: Right: Thick: Yes Discolored: Yes Deformed: Yes Improper Length and Hygiene: No Electronic Signature(s) Signed: 07/04/2014 5:16:59 PM By: Elliot Gurney, RN, BSN, Kim RN, BSN Entered By: Elliot Gurney, RN, BSN, Kim on 07/04/2014 16:13:23 Tracy Le ([id number]) -------------------------------------------------------------------------------- Multi Wound Chart Details Patient Name: Tracy Le Date of Service: 07/04/2014 3:45 PM Medical Record Number:  [id number] Patient Account Number: [id number] Date of Birth/Sex: May 13, 1913 (81 y.o. Female) Treating RN: Curtis Sites Primary Care Physician: Corky Downs Other Clinician: Referring Physician: Corky Downs Treating Physician/Extender: Rudene Re in Treatment: 22 Vital Signs Height(in): 69 Pulse(bpm): 80 Weight(lbs): 189 Blood Pressure 110/83 (mmHg): Body Mass Index(BMI): 28 Temperature(F): 98.1 Respiratory Rate 16 (breaths/min): Photos: [1:No Photos] [N/A:N/A] Wound Location: [1:Left Calcaneous] [N/A:N/A] Wounding Event: [1:Pressure Injury] [N/A:N/A] Primary Etiology: [1:Pressure Ulcer] [N/A:N/A] Comorbid History: [1:History  of pressure wounds, Osteoarthritis, Dementia] [N/A:N/A] Date Acquired: [1:12/31/2013] [N/A:N/A] Weeks of Treatment: [1:22] [N/A:N/A] Wound Status: [1:Open] [N/A:N/A] Measurements L x W x D 0.4x0.5x0.7 [N/A:N/A] (cm) Area (cm) : [1:0.157] [N/A:N/A] Volume (cm) : [1:0.11] [N/A:N/A] % Reduction in Area: [1:98.20%] [N/A:N/A] % Reduction in Volume: 87.50% [N/A:N/A] Starting Position 1 5 (o'clock): Ending Position 1 [1:10] (o'clock): Maximum Distance 1 0.9 (cm): Undermining: [1:Yes] [N/A:N/A] Classification: [1:Category/Stage III] [N/A:N/A] Exudate Amount: [1:Small] [N/A:N/A] Exudate Type: [1:Serous] [N/A:N/A] Exudate Color: [1:amber] [N/A:N/A] Wound Margin: [1:Distinct, outline attached] [N/A:N/A] Granulation Amount: [1:Large (67-100%)] [N/A:N/A] Granulation Quality: [1:Red] [N/A:N/A] Necrotic Amount: Small (1-33%) N/A N/A Exposed Structures: Fascia: No N/A N/A Fat: No Tendon: No Muscle: No Joint: No Bone: No Limited to Skin Breakdown Epithelialization: Small (1-33%) N/A N/A Periwound Skin Texture: Edema: No N/A N/A Excoriation: No Induration: No Callus: No Crepitus: No Fluctuance: No Friable: No Rash: No Scarring: No Periwound Skin Moist: Yes N/A N/A Moisture: Maceration: No Dry/Scaly: No Periwound Skin Color:  Atrophie Blanche: No N/A N/A Cyanosis: No Ecchymosis: No Erythema: No Hemosiderin Staining: No Mottled: No Pallor: No Rubor: No Temperature: No Abnormality N/A N/A Tenderness on No N/A N/A Palpation: Wound Preparation: Ulcer Cleansing: N/A N/A Rinsed/Irrigated with Saline Topical Anesthetic Applied: Other: Lidocaine 4% Ointment Treatment Notes Electronic Signature(s) Signed: 07/04/2014 5:11:42 PM By: Curtis Sitesorthy, Joanna Entered By: Curtis Sitesorthy, Joanna on 07/04/2014 16:26:12 Tracy Le, Tracy Le ([id number]) -------------------------------------------------------------------------------- Multi-Disciplinary Care Plan Details Patient Name: Tracy Le, Tracy Le Date of Service: 07/04/2014 3:45 PM Medical Record Number: [id number] Patient Account Number: [id number] Date of Birth/Sex: July 26, 1913 (81 y.o. Female) Treating RN: Curtis Sitesorthy, Joanna Primary Care Physician: Corky DownsMASOUD, JAVED Other Clinician: Referring Physician: Corky DownsMASOUD, JAVED Treating Physician/Extender: Rudene ReBritto, Errol Weeks in Treatment: 22 Active Inactive Abuse / Safety / Falls / Self Care Management Nursing Diagnoses: Potential for falls Goals: Patient will remain injury free Date Initiated: 01/31/2014 Goal Status: Active Interventions: Assess fall risk on admission and as needed Notes: Nutrition Nursing Diagnoses: Potential for alteratiion in Nutrition/Potential for imbalanced nutrition Goals: Patient/caregiver agrees to and verbalizes understanding of need to use nutritional supplements and/or vitamins as prescribed Date Initiated: 01/31/2014 Goal Status: Active Interventions: Assess patient nutrition upon admission and as needed per policy Notes: Pressure Nursing Diagnoses: Potential for impaired tissue integrity related to pressure, friction, moisture, and shear Goals: Patient will remain free from development of additional pressure ulcers Scheffler, Tahirih ([id number]) Date Initiated: 01/31/2014 Goal Status: Active Interventions: Assess  potential for pressure ulcer upon admission and as needed Notes: Wound/Skin Impairment Nursing Diagnoses: Impaired tissue integrity Goals: Ulcer/skin breakdown will have a volume reduction of 30% by week 4 Date Initiated: 01/31/2014 Goal Status: Active Interventions: Assess ulceration(s) every visit Notes: Electronic Signature(s) Signed: 07/04/2014 5:11:42 PM By: Curtis Sitesorthy, Joanna Entered By: Curtis Sitesorthy, Joanna on 07/04/2014 16:26:03 Tracy Le, Tracy Le ([id number]) -------------------------------------------------------------------------------- Pain Assessment Details Patient Name: Tracy Le, Tracy Le Date of Service: 07/04/2014 3:45 PM Medical Record Number: [id number] Patient Account Number: [id number] Date of Birth/Sex: July 26, 1913 (81 y.o. Female) Treating RN: Huel CoventryWoody, Kim Primary Care Physician: Corky DownsMASOUD, JAVED Other Clinician: Referring Physician: Corky DownsMASOUD, JAVED Treating Physician/Extender: Rudene ReBritto, Errol Weeks in Treatment: 22 Active Problems Location of Pain Severity and Description of Pain Patient Has Paino No Site Locations Pain Management and Medication Current Pain Management: Electronic Signature(s) Signed: 07/04/2014 5:16:59 PM By: Elliot GurneyWoody, RN, BSN, Kim RN, BSN Entered By: Elliot GurneyWoody, RN, BSN, Kim on 07/04/2014 16:02:04 Tracy Le, Tracy Le ([id number]) -------------------------------------------------------------------------------- Patient/Caregiver Education Details Patient Name: Tracy Le, Tracy Le Date of Service: 07/04/2014 3:45 PM Medical Record Number: [id number] Patient Account Number: [id number] Date of Birth/Gender: July 26, 1913 (81 y.o.  Female) Treating RN: Curtis Sites Primary Care Physician: Corky Downs Other Clinician: Referring Physician: Corky Downs Treating Physician/Extender: Rudene Re in Treatment: 22 Education Assessment Education Provided To: Patient Education Topics Provided Wound/Skin Impairment: Handouts: Other: wound healing by MD Methods: Demonstration,  Explain/Verbal Responses: State content correctly Electronic Signature(s) Signed: 07/04/2014 5:11:42 PM By: Curtis Sites Entered By: Curtis Sites on 07/04/2014 16:27:44 Tracy Le ([id number]) -------------------------------------------------------------------------------- Wound Assessment Details Patient Name: Tracy Le Date of Service: 07/04/2014 3:45 PM Medical Record Number: [id number] Patient Account Number: [id number] Date of Birth/Sex: 1913/12/19 (81 y.o. Female) Treating RN: Huel Coventry Primary Care Physician: Corky Downs Other Clinician: Referring Physician: Corky Downs Treating Physician/Extender: Rudene Re in Treatment: 22 Wound Status Wound Number: 1 Primary Pressure Ulcer Etiology: Wound Location: Left Calcaneous Wound Open Wounding Event: Pressure Injury Status: Date Acquired: 12/31/2013 Comorbid History of pressure wounds, Weeks Of Treatment: 22 History: Osteoarthritis, Dementia Clustered Wound: No Photos Photo Uploaded By: Elliot Gurney, RN, BSN, Kim on 07/04/2014 16:40:18 Wound Measurements Length: (cm) 0.4 Width: (cm) 0.5 Depth: (cm) 0.7 Area: (cm) 0.157 Volume: (cm) 0.11 % Reduction in Area: 98.2% % Reduction in Volume: 87.5% Epithelialization: Small (1-33%) Tunneling: No Undermining: Yes Starting Position (o'clock): 5 Ending Position (o'clock): 10 Maximum Distance: (cm) 0.9 Wound Description Classification: Category/Stage III Wound Margin: Distinct, outline attached Exudate Amount: Small Exudate Type: Serous Exudate Color: amber Foul Odor After Cleansing: No Wound Bed Luddy, Quintessa ([id number]) Granulation Amount: Large (67-100%) Exposed Structure Granulation Quality: Red Fascia Exposed: No Necrotic Amount: Small (1-33%) Fat Layer Exposed: No Necrotic Quality: Adherent Slough Tendon Exposed: No Muscle Exposed: No Joint Exposed: No Bone Exposed: No Limited to Skin Breakdown Periwound Skin Texture Texture Color No  Abnormalities Noted: No No Abnormalities Noted: No Callus: No Atrophie Blanche: No Crepitus: No Cyanosis: No Excoriation: No Ecchymosis: No Fluctuance: No Erythema: No Friable: No Hemosiderin Staining: No Induration: No Mottled: No Localized Edema: No Pallor: No Rash: No Rubor: No Scarring: No Temperature / Pain Moisture Temperature: No Abnormality No Abnormalities Noted: No Dry / Scaly: No Maceration: No Moist: Yes Wound Preparation Ulcer Cleansing: Rinsed/Irrigated with Saline Topical Anesthetic Applied: Other: Lidocaine 4% Ointment, Treatment Notes Wound #1 (Left Calcaneous) 1. Cleansed with: Clean wound with Normal Saline 2. Anesthetic Topical Lidocaine 4% cream to wound bed prior to debridement 4. Dressing Applied: Iodoform packing Gauze 5. Secondary Dressing Applied ABD and Kerlix/Conform 7. Secured with Secretary/administrator) Signed: 07/04/2014 5:16:59 PM By: Elliot Gurney RN, BSN, Kim RN, BSN Shelley, Tracy Le ([id number]) Entered By: Elliot Gurney RN, BSN, Kim on 07/04/2014 16:09:20 EVELENE, ROUSSIN ([id number]) -------------------------------------------------------------------------------- Vitals Details Patient Name: Tracy Le Date of Service: 07/04/2014 3:45 PM Medical Record Number: [id number] Patient Account Number: [id number] Date of Birth/Sex: 06-17-1913 (81 y.o. Female) Treating RN: Huel Coventry Primary Care Physician: Corky Downs Other Clinician: Referring Physician: Corky Downs Treating Physician/Extender: Rudene Re in Treatment: 22 Vital Signs Time Taken: 04:02 Temperature (F): 98.1 Height (in): 69 Pulse (bpm): 80 Weight (lbs): 189 Respiratory Rate (breaths/min): 16 Body Mass Index (BMI): 27.9 Blood Pressure (mmHg): 110/83 Reference Range: 80 - 120 mg / dl Electronic Signature(s) Signed: 07/04/2014 5:16:59 PM By: Elliot Gurney, RN, BSN, Kim RN, BSN Entered By: Elliot Gurney, RN, BSN, Kim on 07/04/2014 16:05:14

## 2014-07-11 ENCOUNTER — Encounter: Payer: Medicare Other | Admitting: Surgery

## 2014-07-11 DIAGNOSIS — L89623 Pressure ulcer of left heel, stage 3: Secondary | ICD-10-CM | POA: Diagnosis not present

## 2014-07-11 NOTE — Progress Notes (Addendum)
JARED, CAHN ([id number]) Visit Report for 07/11/2014 Arrival Information Details Patient Name: Tracy Le, Tracy Le Date of Service: 07/11/2014 3:45 PM Medical Record Number: [id number] Patient Account Number: [id number] Date of Birth/Sex: 1913-06-16 (81 y.o. Female) Treating RN: Curtis Sites Primary Care Physician: Corky Downs Other Clinician: Referring Physician: Corky Downs Treating Physician/Extender: Rudene Re in Treatment: 23 Visit Information History Since Last Visit Added or deleted any medications: No Patient Arrived: Wheel Chair Any new allergies or adverse reactions: No Arrival Time: 16:11 Had a fall or experienced change in No Accompanied By: dtr in law activities of daily living that may affect Transfer Assistance: Manual risk of falls: Patient Identification Verified: Yes Signs or symptoms of abuse/neglect since last No Secondary Verification Process Yes visito Completed: Hospitalized since last visit: No Patient Has Alerts: Yes Pain Present Now: No Patient Alerts: 1/16 ABI L: 1.23 Electronic Signature(s) Signed: 07/11/2014 5:12:42 PM By: Curtis Sites Entered By: Curtis Sites on 07/11/2014 16:12:17 Tracy Le ([id number]) -------------------------------------------------------------------------------- Encounter Discharge Information Details Patient Name: Tracy Le Date of Service: 07/11/2014 3:45 PM Medical Record Number: [id number] Patient Account Number: [id number] Date of Birth/Sex: 1913/09/08 (81 y.o. Female) Treating RN: Primary Care Physician: Corky Downs Other Clinician: Referring Physician: Corky Downs Treating Physician/Extender: Rudene Re in Treatment: 23 Encounter Discharge Information Items Discharge Pain Level: 0 Discharge Condition: Stable Ambulatory Status: Wheelchair Discharge Destination: Home Transportation: Private Auto Accompanied By: self Schedule Follow-up Appointment: Yes Medication Reconciliation  completed and provided to Patient/Care No Tracy Le: Provided on Clinical Summary of Care: 07/11/2014 Form Type Recipient Paper Patient LL Electronic Signature(s) Signed: 07/11/2014 4:45:20 PM By: Curtis Sites Previous Signature: 07/11/2014 4:40:30 PM Version By: Gwenlyn Perking Entered By: Curtis Sites on 07/11/2014 16:45:20 Tracy Le ([id number]) -------------------------------------------------------------------------------- Lower Extremity Assessment Details Patient Name: Tracy Le Date of Service: 07/11/2014 3:45 PM Medical Record Number: [id number] Patient Account Number: [id number] Date of Birth/Sex: [birth year] (81 y.o. Female) Treating RN: Curtis Sites Primary Care Physician: Corky Downs Other Clinician: Referring Physician: Corky Downs Treating Physician/Extender: Rudene Re in Treatment: 23 Vascular Assessment Pulses: Posterior Tibial Extremity colors, hair growth, and conditions: Extremity Color: [Right:Normal] Hair Growth on Extremity: [Right:No] Temperature of Extremity: [Right:Cool] Capillary Refill: [Right:< 3 seconds] Electronic Signature(s) Signed: 07/11/2014 5:12:42 PM By: Curtis Sites Entered By: Curtis Sites on 07/11/2014 16:21:20 Tracy Le ([id number]) -------------------------------------------------------------------------------- Multi Wound Chart Details Patient Name: Tracy Le Date of Service: 07/11/2014 3:45 PM Medical Record Number: [id number] Patient Account Number: [id number] Date of Birth/Sex: [birth year] (81 y.o. Female) Treating RN: Curtis Sites Primary Care Physician: Corky Downs Other Clinician: Referring Physician: Corky Downs Treating Physician/Extender: Rudene Re in Treatment: 23 Vital Signs Height(in): 69 Pulse(bpm): 72 Weight(lbs): 189 Blood Pressure 111/59 (mmHg): Body Mass Index(BMI): 28 Temperature(F): 97.5 Respiratory Rate 16 (breaths/min): Photos: [1:No Photos] [N/A:N/A] Wound  Location: [1:Left Calcaneous] [N/A:N/A] Wounding Event: [1:Pressure Injury] [N/A:N/A] Primary Etiology: [1:Pressure Ulcer] [N/A:N/A] Comorbid History: [1:History of pressure wounds, Osteoarthritis, Dementia] [N/A:N/A] Date Acquired: [1:12/31/2013] [N/A:N/A] Weeks of Treatment: [1:23] [N/A:N/A] Wound Status: [1:Open] [N/A:N/A] Measurements L x W x D 0.4x0.5x0.6 [N/A:N/A] (cm) Area (cm) : [1:0.157] [N/A:N/A] Volume (cm) : [1:0.094] [N/A:N/A] % Reduction in Area: [1:98.20%] [N/A:N/A] % Reduction in Volume: 89.30% [N/A:N/A] Classification: [1:Category/Stage III] [N/A:N/A] Exudate Amount: [1:Small] [N/A:N/A] Exudate Type: [1:Serous] [N/A:N/A] Exudate Color: [1:amber] [N/A:N/A] Wound Margin: [1:Distinct, outline attached] [N/A:N/A] Granulation Amount: [1:Large (67-100%)] [N/A:N/A] Granulation Quality: [1:Red] [N/A:N/A] Necrotic Amount: [1:Small (1-33%)] [N/A:N/A] Exposed Structures: [1:Fascia: No Fat: No Tendon: No Muscle: No Joint: No Bone: No] [N/A:N/A] Limited to Skin Breakdown Epithelialization: Small (  1-33%) N/A N/A Periwound Skin Texture: Callus: Yes N/A N/A Edema: No Excoriation: No Induration: No Crepitus: No Fluctuance: No Friable: No Rash: No Scarring: No Periwound Skin Moist: Yes N/A N/A Moisture: Maceration: No Dry/Scaly: No Periwound Skin Color: Atrophie Blanche: No N/A N/A Cyanosis: No Ecchymosis: No Erythema: No Hemosiderin Staining: No Mottled: No Pallor: No Rubor: No Temperature: No Abnormality N/A N/A Tenderness on No N/A N/A Palpation: Wound Preparation: Ulcer Cleansing: N/A N/A Rinsed/Irrigated with Saline Topical Anesthetic Applied: Other: Lidocaine 4% Ointment Treatment Notes Electronic Signature(s) Signed: 07/11/2014 5:12:42 PM By: Curtis Sites Entered By: Curtis Sites on 07/11/2014 16:21:36 Tracy Le, Tracy Le ([id number]) -------------------------------------------------------------------------------- Multi-Disciplinary Care Plan  Details Patient Name: Tracy Le Date of Service: 07/11/2014 3:45 PM Medical Record Number: [id number] Patient Account Number: [id number] Date of Birth/Sex: 09-25-1913 (81 y.o. Female) Treating RN: Curtis Sites Primary Care Physician: Corky Downs Other Clinician: Referring Physician: Corky Downs Treating Physician/Extender: Rudene Re in Treatment: 75 Active Inactive Abuse / Safety / Falls / Self Care Management Nursing Diagnoses: Potential for falls Goals: Patient will remain injury free Date Initiated: 01/31/2014 Goal Status: Active Interventions: Assess fall risk on admission and as needed Notes: Nutrition Nursing Diagnoses: Potential for alteratiion in Nutrition/Potential for imbalanced nutrition Goals: Patient/caregiver agrees to and verbalizes understanding of need to use nutritional supplements and/or vitamins as prescribed Date Initiated: 01/31/2014 Goal Status: Active Interventions: Assess patient nutrition upon admission and as needed per policy Notes: Pressure Nursing Diagnoses: Potential for impaired tissue integrity related to pressure, friction, moisture, and shear Goals: Patient will remain free from development of additional pressure ulcers Tracy Le, Tracy Le ([id number]) Date Initiated: 01/31/2014 Goal Status: Active Interventions: Assess potential for pressure ulcer upon admission and as needed Notes: Wound/Skin Impairment Nursing Diagnoses: Impaired tissue integrity Goals: Ulcer/skin breakdown will have a volume reduction of 30% by week 4 Date Initiated: 01/31/2014 Goal Status: Active Interventions: Assess ulceration(s) every visit Notes: Electronic Signature(s) Signed: 07/11/2014 5:12:42 PM By: Curtis Sites Entered By: Curtis Sites on 07/11/2014 16:21:29 Tracy Le ([id number]) -------------------------------------------------------------------------------- Patient/Caregiver Education Details Patient Name: Tracy Le Date of Service:  07/11/2014 3:45 PM Medical Record Number: [id number] Patient Account Number: [id number] Date of Birth/Gender: 1913/04/03 (81 y.o. Female) Treating RN: Curtis Sites Primary Care Physician: Corky Downs Other Clinician: Referring Physician: Corky Downs Treating Physician/Extender: Rudene Re in Treatment: 7 Education Assessment Education Provided To: Caregiver Education Topics Provided Wound/Skin Impairment: Handouts: Other: wound care as ordered Methods: Demonstration, Explain/Verbal Responses: State content correctly Electronic Signature(s) Signed: 07/11/2014 4:45:43 PM By: Curtis Sites Entered By: Curtis Sites on 07/11/2014 16:45:43 Tracy Le, Tracy Le ([id number]) -------------------------------------------------------------------------------- Wound Assessment Details Patient Name: Tracy Le Date of Service: 07/11/2014 3:45 PM Medical Record Number: [id number] Patient Account Number: [id number] Date of Birth/Sex: [birth year] (81 y.o. Female) Treating RN: Curtis Sites Primary Care Physician: Corky Downs Other Clinician: Referring Physician: Corky Downs Treating Physician/Extender: Rudene Re in Treatment: 23 Wound Status Wound Number: 1 Primary Pressure Ulcer Etiology: Wound Location: Left Calcaneous Wound Open Wounding Event: Pressure Injury Status: Date Acquired: 12/31/2013 Comorbid History of pressure wounds, Weeks Of Treatment: 23 History: Osteoarthritis, Dementia Clustered Wound: No Photos Wound Measurements Length: (cm) 0.4 Width: (cm) 0.5 Depth: (cm) 0.6 Area: (cm) 0.157 Volume: (cm) 0.094 % Reduction in Area: 98.2% % Reduction in Volume: 89.3% Epithelialization: Small (1-33%) Tunneling: No Undermining: No Wound Description Classification: Category/Stage III Wound Margin: Distinct, outline attached Exudate Amount: Small Exudate Type: Serous Exudate Color: amber Foul Odor After Cleansing: No Wound Bed Granulation Amount:  Large (67-100%) Exposed Structure Granulation Quality:  Red Fascia Exposed: No Necrotic Amount: Small (1-33%) Fat Layer Exposed: No Necrotic Quality: Adherent Slough Tendon Exposed: No Muscle Exposed: No Kauzlarich, Tracy Le ([id number]) Joint Exposed: No Bone Exposed: No Limited to Skin Breakdown Periwound Skin Texture Texture Color No Abnormalities Noted: No No Abnormalities Noted: No Callus: Yes Atrophie Blanche: No Crepitus: No Cyanosis: No Excoriation: No Ecchymosis: No Fluctuance: No Erythema: No Friable: No Hemosiderin Staining: No Induration: No Mottled: No Localized Edema: No Pallor: No Rash: No Rubor: No Scarring: No Temperature / Pain Moisture Temperature: No Abnormality No Abnormalities Noted: No Dry / Scaly: No Maceration: No Moist: Yes Wound Preparation Ulcer Cleansing: Rinsed/Irrigated with Saline Topical Anesthetic Applied: Other: Lidocaine 4% Ointment, Treatment Notes Wound #1 (Left Calcaneous) 1. Cleansed with: Clean wound with Normal Saline 2. Anesthetic Topical Lidocaine 4% cream to wound bed prior to debridement 4. Dressing Applied: Iodoform packing Gauze 5. Secondary Dressing Applied ABD and Kerlix/Conform 7. Secured with Secretary/administrator) Signed: 07/12/2014 5:01:52 PM By: Curtis Sites Previous Signature: 07/11/2014 5:12:42 PM Version By: Curtis Sites Entered By: Curtis Sites on 07/12/2014 17:01:52 Tracy Le, Tracy Le ([id number]) -------------------------------------------------------------------------------- Vitals Details Patient Name: Tracy Le Date of Service: 07/11/2014 3:45 PM Medical Record Number: [id number] Patient Account Number: [id number] Date of Birth/Sex: 1913/10/10 (81 y.o. Female) Treating RN: Curtis Sites Primary Care Physician: Corky Downs Other Clinician: Referring Physician: Corky Downs Treating Physician/Extender: Rudene Re in Treatment: 23 Vital Signs Time Taken: 16:16 Temperature (F):  97.5 Height (in): 69 Pulse (bpm): 72 Weight (lbs): 189 Respiratory Rate (breaths/min): 16 Body Mass Index (BMI): 27.9 Blood Pressure (mmHg): 111/59 Reference Range: 80 - 120 mg / dl Electronic Signature(s) Signed: 07/11/2014 5:12:42 PM By: Curtis Sites Entered By: Curtis Sites on 07/11/2014 16:16:57

## 2014-07-11 NOTE — Progress Notes (Addendum)
Tracy, Le ([id number]) Visit Report for 07/11/2014 Chief Complaint Document Details Patient Name: Tracy Le, Tracy Le Date of Service: 07/11/2014 3:45 PM Medical Record Number: [id number] Patient Account Number: [id number] Date of Birth/Sex: 01/19/1914 (81 y.o. 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 She has no fresh complaints. she does not walk around much except transferring from a chair to the bed. Electronic Signature(s) Signed: 07/11/2014 4:38:19 PM By: Evlyn Kanner MD, FACS Entered By: Evlyn Kanner on 07/11/2014 16:35:44 Tracy, Le ([id number]) -------------------------------------------------------------------------------- Debridement Details Patient Name: Tracy Le Date of Service: 07/11/2014 3:45 PM Medical Record Number: [id number] Patient Account Number: [id number] Date of Birth/Sex: Sep 10, 1913 (81 y.o. Female) Treating RN: Primary Care Physician: Corky Downs Other Clinician: Referring Physician: Corky Downs Treating Physician/Extender: Rudene Re in Treatment: 23 Debridement Performed for Wound #1 Left Calcaneous Assessment: Performed By: Physician Tristan Schroeder., MD Debridement: Open Wound/Selective Debridement Selective Description: Pre-procedure Yes Verification/Time Out Taken: Start Time: 04:30 Pain Control: Lidocaine 5% topical ointment Level: Skin/Dermis Total Area Debrided (L x 0.4 (cm) x 0.5 (cm) = 0.2 (cm) W): Tissue and other Non-Viable, Callus, Eschar, Skin material debrided: Instrument: Forceps, Scissors Bleeding: Minimum Hemostasis Achieved: Pressure End Time: 04:33 Procedural Pain: 0 Post Procedural Pain: 0 Response to  Treatment: Procedure was tolerated well Post Debridement Measurements of Total Wound Length: (cm) 0.4 Stage: Category/Stage III Width: (cm) 0.5 Depth: (cm) 0.6 Volume: (cm) 0.094 Electronic Signature(s) Signed: 07/11/2014 4:38:19 PM By: Evlyn Kanner MD, FACS Entered By: Evlyn Kanner on 07/11/2014 16:35:36 Tracy Le ([id number]) -------------------------------------------------------------------------------- HPI Details Patient Name: Tracy Le Date of Service: 07/11/2014 3:45 PM Medical Record Number: [id number] Patient Account Number: [id number] Date of Birth/Sex: [birth year] (81 y.o. Female) Treating RN: Primary Care Physician: Corky Downs Other Clinician: Referring Physician: Corky Downs Treating Physician/Extender: Rudene Re in Treatment: 23 History of Present Illness Location: left calcaneus Quality: Patient reports No Pain. Severity: Patient states wound (s) are getting better. Duration: Patient states that they are not certain how long the wound has been present. Timing: nil Context: The wound appeared gradually over time Modifying Factors: sitting most of the day Associated Signs and Symptoms: Patient reports having difficulty standing for long periods. HPI Description: The patient is an 81 year old female with a treatment history of a stage III left calcaneal wound. The patient is mostly bedbound at home. She does spend some time sitting up in a chair. She does not have an air mattress at home. Her caretaker has been using triple antibiotic on the bone itself. She denies any fevers at home. The caretaker denies any history of diabetes or vascular disease. Since being seen here here in the wound clinic she has been applying Santyl to the ulcer base. the patient returns for follow-up today. They have been compliant with dressing changes. 03/29/14 -- the dressings have been done on a daily basis according to plan and the daughter says that she is running out  of Santyl ointment and would need a new prescription. 04/26/2014 patient's daughter-in-law is comfortable doing the dressing and after discussing with her it is okay if the home health stops going to do the dressing once a week. 03/29/14 - she seems to be doing fine and has no fresh issues. The daughter has been doing her dressings  on a regular basis and has been doing them according to the plan. 05/09/2014 the daughter noticed a small ulcer on the right buttock medially which is tiny and a stage II pressure ulcer. Electronic Signature(s) Signed: 07/11/2014 4:38:19 PM By: Evlyn Kanner MD, FACS Entered By: Evlyn Kanner on 07/11/2014 16:35:50 Tracy Le, Tracy Le ([id number]) -------------------------------------------------------------------------------- Physical Exam Details Patient Name: Tracy Le Date of Service: 07/11/2014 3:45 PM Medical Record Number: [id number] Patient Account Number: [id number] Date of Birth/Sex: 08-27-1913 (81 y.o. Female) Treating RN: Primary Care Physician: Corky Downs Other Clinician: Referring Physician: Corky Downs Treating Physician/Extender: Rudene Re in Treatment: 23 Constitutional . Pulse regular. Respirations normal and unlabored. Afebrile. . Eyes Nonicteric. Reactive to light. Ears, Nose, Mouth, and Throat Lips, teeth, and gums WNL.Marland Kitchen Moist mucosa without lesions . Neck supple and nontender. No palpable supraclavicular or cervical adenopathy. Normal sized without goiter. Respiratory WNL. No retractions.. Cardiovascular Pedal Pulses WNL. No clubbing, cyanosis or edema. Integumentary (Hair, Skin) a hard callus had performed over the pressure ulcer and this needs to be sharply debrided.. No crepitus or fluctuance. No peri-wound warmth or erythema. No masses.Marland Kitchen Psychiatric Judgement and insight Intact.. No evidence of depression, anxiety, or agitation.. Electronic Signature(s) Signed: 07/11/2014 4:38:19 PM By: Evlyn Kanner MD, FACS Entered By:  Evlyn Kanner on 07/11/2014 16:36:32 Tracy Le, Tracy Le ([id number]) -------------------------------------------------------------------------------- Physician Orders Details Patient Name: Tracy Le Date of Service: 07/11/2014 3:45 PM Medical Record Number: [id number] Patient Account Number: [id number] Date of Birth/Sex: November 30, 1913 (81 y.o. Female) Treating RN: Curtis Sites Primary Care Physician: Corky Downs Other Clinician: Referring Physician: Corky Downs Treating Physician/Extender: Rudene Re in Treatment: 79 Verbal / Phone Orders: Yes Clinician: Curtis Sites Read Back and Verified: Yes Diagnosis Coding ICD-10 Coding Code Description [phone] Pressure ulcer of left heel, stage 3 L89.312 Pressure ulcer of right buttock, stage 2 Wound Cleansing Wound #1 Left Calcaneous o Clean wound with Normal Saline. Anesthetic Wound #1 Left Calcaneous o Topical Lidocaine 4% cream applied to wound bed prior to debridement Primary Wound Dressing Wound #1 Left Calcaneous o Iodoform packing Gauze Secondary Dressing Wound #1 Left Calcaneous o ABD and Kerlix/Conform Dressing Change Frequency Wound #1 Left Calcaneous o Change dressing every other day. Follow-up Appointments Wound #1 Left Calcaneous o Return Appointment in 1 week. Off-Loading Wound #1 Left Calcaneous o Turn and reposition every 2 hours Additional Orders / Instructions Tracy Le, Tracy Le ([id number]) Wound #1 Left Calcaneous o Increase protein intake. o Activity as tolerated o Other: - sage boots Electronic Signature(s) Signed: 07/11/2014 4:44:16 PM By: Curtis Sites Signed: 07/12/2014 12:20:02 PM By: Evlyn Kanner MD, FACS Entered By: Curtis Sites on 07/11/2014 16:44:16 Tracy Le, Tracy Le ([id number]) -------------------------------------------------------------------------------- Problem List Details Patient Name: Tracy Le Date of Service: 07/11/2014 3:45 PM Medical Record Number:  [id number] Patient Account Number: [id number] Date of Birth/Sex: Jun 16, 1913 (81 y.o. Female) Treating RN: Primary Care Physician: Corky Downs Other Clinician: Referring Physician: Corky Downs Treating Physician/Extender: Rudene Re in Treatment: 23 Active Problems ICD-10 Encounter Code Description Active Date Diagnosis L89.623 Pressure ulcer of left heel, stage 3 01/31/2014 Yes L89.312 Pressure ulcer of right buttock, stage 2 05/09/2014 Yes Inactive Problems Resolved Problems Electronic Signature(s) Signed: 07/11/2014 4:38:19 PM By: Evlyn Kanner MD, FACS Entered By: Evlyn Kanner on 07/11/2014 16:34:03 Tracy Le ([id number]) -------------------------------------------------------------------------------- Progress Note Details Patient Name: Tracy Le Date of Service: 07/11/2014 3:45 PM Medical Record Number: [id number] Patient Account Number: [id number] Date of Birth/Sex: November 22, 1913 (81 y.o. 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 She has no fresh complaints. she does not walk around much except transferring from a chair to the bed. History of Present Illness (HPI) The following HPI elements were documented for the patient's wound: Location: left calcaneus Quality: Patient reports No Pain. Severity: Patient states wound (s) are getting better. Duration: Patient states that they are not certain how long the wound has been present. Timing: nil Context: The wound appeared gradually over time Modifying Factors: sitting most of the day Associated Signs and Symptoms: Patient reports having difficulty standing for long periods. The patient is an  81 year old female with a treatment history of a stage III left calcaneal wound. The patient is mostly bedbound at home. She does spend some time sitting up in a chair. She does not have an air mattress at home. Her caretaker has been using triple antibiotic on the bone itself. She denies any fevers at home. The caretaker denies any history of diabetes or vascular disease. Since being seen here here in the wound clinic she has been applying Santyl to the ulcer base. the patient returns for follow-up today. They have been compliant with dressing changes. 03/29/14 -- the dressings have been done on a daily basis according to plan and the daughter says that she is running out of Santyl ointment and would need a new prescription. 04/26/2014 patient's daughter-in-law is comfortable doing the dressing and after discussing with her it is okay if the home health stops going to do the dressing once a week. 03/29/14 - she seems to be doing fine and has no fresh issues. The daughter has been doing her dressings on a regular basis and has been doing them according to the plan. 05/09/2014 the daughter noticed a small ulcer on the right buttock medially which is tiny and a stage II pressure ulcer. Tracy Le, Tracy Le ([id number]) Objective Constitutional Pulse regular. Respirations normal and unlabored. Afebrile. Vitals Time Taken: 4:16 PM, Height: 69 in, Weight: 189 lbs, BMI: 27.9, Temperature: 97.5 F, Pulse: 72 bpm, Respiratory Rate: 16 breaths/min, Blood Pressure: 111/59 mmHg. Eyes Nonicteric. Reactive to light. Ears, Nose, Mouth, and Throat Lips, teeth, and gums WNL.Marland Kitchen Moist mucosa without lesions . Neck supple and nontender. No palpable supraclavicular or cervical adenopathy. Normal sized without goiter. Respiratory WNL. No retractions.. Cardiovascular Pedal Pulses WNL. No clubbing, cyanosis or edema. Psychiatric Judgement and insight Intact.. No evidence of depression, anxiety, or  agitation.. Integumentary (Hair, Skin) a hard callus had performed over the pressure ulcer and this needs to be sharply debrided.. No crepitus or fluctuance. No peri-wound warmth or erythema. No masses.. Wound #1 status is Open. Original cause of wound was Pressure Injury. The wound is located on the Left Calcaneous. The wound measures 0.4cm length x 0.5cm width x 0.6cm depth; 0.157cm^2 area and 0.094cm^3 volume. The wound is limited to skin breakdown. There is no tunneling or undermining noted. There is a small amount of serous drainage noted. The wound margin is distinct with the outline attached to the wound base. There is large (67-100%) red granulation within the wound bed. There is a small (1-33%) amount of necrotic tissue within the wound bed including Adherent Slough. The periwound skin appearance exhibited: Callus, Moist. The periwound skin appearance did not exhibit: Crepitus, Excoriation, Fluctuance, Friable, Induration, Localized Edema, Rash, Scarring, Dry/Scaly, Maceration, Atrophie  Blanche, Cyanosis, Ecchymosis, Hemosiderin Staining, Mottled, Pallor, Rubor, Erythema. Periwound temperature was noted as No Abnormality. Tracy Le, Tracy Le ([id number]) Assessment Active Problems ICD-10 [phone] - Pressure ulcer of left heel, stage 3 L89.312 - Pressure ulcer of right buttock, stage 2 Once the callus has been sharply debrided the ulcerated areas become much shallow and there is good granulation tissue. We will continue to pack lightly with iodoform gauze. Procedures Wound #1 Wound #1 is a Pressure Ulcer located on the Left Calcaneous . There was a Skin/Dermis Open Wound/Selective [phone]) debridement with total area of 0.2 sq cm performed by Hezzie Karim, Ignacia Felling., MD. with the following instrument(s): Forceps and Scissors to remove Non-Viable tissue/material including Eschar, Skin, and Callus after achieving pain control using Lidocaine 5% topical ointment. A time out was conducted prior  to the start of the procedure. A Minimum amount of bleeding was controlled with Pressure. The procedure was tolerated well with a pain level of 0 throughout and a pain level of 0 following the procedure. Post Debridement Measurements: 0.4cm length x 0.5cm width x 0.6cm depth; 0.094cm^3 volume. Post debridement Stage noted as Category/Stage III. Plan Wound Cleansing: Wound #1 Left Calcaneous: Clean wound with Normal Saline. Anesthetic: Wound #1 Left Calcaneous: Topical Lidocaine 4% cream applied to wound bed prior to debridement Primary Wound Dressing: Wound #1 Left Calcaneous: Iodoform packing Gauze Secondary Dressing: Wound #1 Left Calcaneous: ABD and Kerlix/Conform Dressing Change FrequencyAdrienna Le, Tracy Le ([id number]) Wound #1 Left Calcaneous: Change dressing every other day. Follow-up Appointments: Wound #1 Left Calcaneous: Return Appointment in 1 week. Off-Loading: Wound #1 Left Calcaneous: Turn and reposition every 2 hours Additional Orders / Instructions: Wound #1 Left Calcaneous: Increase protein intake. Activity as tolerated Other: - sage boots Once the callus has been sharply debrided the ulcerated areas become much shallow and there is good granulation tissue. We will continue to pack lightly with iodoform gauze. Electronic Signature(s) Signed: 07/13/2014 4:45:26 PM By: Evlyn Kanner MD, FACS Previous Signature: 07/12/2014 5:03:08 PM Version By: Evlyn Kanner MD, FACS Previous Signature: 07/11/2014 4:38:19 PM Version By: Evlyn Kanner MD, FACS Entered By: Evlyn Kanner on 07/13/2014 16:44:32 Tracy Le, Tracy Le ([id number]) -------------------------------------------------------------------------------- SuperBill Details Patient Name: Tracy Le Date of Service: 07/11/2014 Medical Record Number: [id number] Patient Account Number: [id number] Date of Birth/Sex: 1913-08-13 (81 y.o. Female) Treating RN: Primary Care Physician: Corky Downs Other Clinician: Referring Physician:  Corky Downs Treating Physician/Extender: Rudene Re in Treatment: 23 Diagnosis Coding ICD-10 Codes Code Description [phone] Pressure ulcer of left heel, stage 3 L89.312 Pressure ulcer of right buttock, stage 2 Facility Procedures CPT4 Code: [id number] Description: 97597 - DEBRIDE WOUND 1ST 20 SQ CM OR < ICD-10 Description Diagnosis L89.623 Pressure ulcer of left heel, stage 3 L89.312 Pressure ulcer of right buttock, stage 2 Modifier: Quantity: 1 Physician Procedures CPT4 Code: [id number] Description: 97597 - WC PHYS DEBR WO ANESTH 20 SQ CM ICD-10 Description Diagnosis L89.623 Pressure ulcer of left heel, stage 3 L89.312 Pressure ulcer of right buttock, stage 2 Modifier: Quantity: 1 Electronic Signature(s) Signed: 07/11/2014 4:38:19 PM By: Evlyn Kanner MD, FACS Entered By: Evlyn Kanner on 07/11/2014 16:37:40

## 2014-07-18 ENCOUNTER — Encounter: Payer: Medicare Other | Admitting: Surgery

## 2014-07-18 DIAGNOSIS — L89623 Pressure ulcer of left heel, stage 3: Secondary | ICD-10-CM | POA: Diagnosis not present

## 2014-07-18 NOTE — Progress Notes (Signed)
MEDEA, DEINES ([id number]) Visit Report for 07/18/2014 Chief Complaint Document Details Patient Name: Tracy Le, Tracy Le Date of Service: 07/18/2014 2:30 PM Medical Record Number: [id number] Patient Account Number: [id number] Date of Birth/Sex: 06-12-1913 (81 y.o. 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 She has no fresh complaints. she does not walk around much except transferring from a chair to the bed. Electronic Signature(s) Signed: 07/18/2014 4:25:54 PM By: Evlyn Kanner MD, FACS Entered By: Evlyn Kanner on 07/18/2014 15:04:18 JANALYNN, EDER ([id number]) -------------------------------------------------------------------------------- HPI Details Patient Name: Tracy Le Date of Service: 07/18/2014 2:30 PM Medical Record Number: [id number] Patient Account Number: [id number] Date of Birth/Sex: 12-21-1913 (81 y.o. Female) Treating RN: Primary Care Physician: Corky Downs Other Clinician: Referring Physician: Corky Downs Treating Physician/Extender: Rudene Re in Treatment: 24 History of Present Illness Location: left calcaneus Quality: Patient reports No Pain. Severity: Patient states wound (s) are getting better. Duration: Patient states that they are not certain how long the wound has been present. Timing: nil Context: The wound appeared gradually over time Modifying Factors: sitting most of the day Associated Signs and Symptoms: Patient reports having difficulty standing for long periods. HPI Description: The patient is an 81 year old female with a treatment history of a stage III left calcaneal wound. The patient is mostly bedbound at home. She does spend some time sitting  up in a chair. She does not have an air mattress at home. Her caretaker has been using triple antibiotic on the bone itself. She denies any fevers at home. The caretaker denies any history of diabetes or vascular disease. Since being seen here here in the wound clinic she has been applying Santyl to the ulcer base. the patient returns for follow-up today. They have been compliant with dressing changes. 03/29/14 -- the dressings have been done on a daily basis according to plan and the daughter says that she is running out of Santyl ointment and would need a new prescription. 04/26/2014 patient's daughter-in-law is comfortable doing the dressing and after discussing with her it is okay if the home health stops going to do the dressing once a week. 03/29/14 - she seems to be doing fine and has no fresh issues. The daughter has been doing her dressings on a regular basis and has been doing them according to the plan. 05/09/2014 the daughter noticed a small ulcer on the right buttock medially which is tiny and a stage II pressure ulcer. Electronic Signature(s) Signed: 07/18/2014 4:25:54 PM By: Evlyn Kanner MD, FACS Entered By: Evlyn Kanner on 07/18/2014 15:04:31 ELNOR, RENOVATO ([id number]) -------------------------------------------------------------------------------- Physical Exam Details Patient Name: Tracy Le Date of Service: 07/18/2014 2:30 PM Medical Record Number: [id number] Patient Account Number: [id number] Date of Birth/Sex: Mar 26, 1913 (81 y.o. Female) Treating RN: Primary Care Physician: Corky Downs Other Clinician: Referring Physician: Corky Downs Treating Physician/Extender: Rudene Re in Treatment: 24 Constitutional . Pulse regular. Respirations normal and unlabored. Afebrile. . Eyes Nonicteric. Reactive to light. Ears, Nose, Mouth, and Throat Lips, teeth, and gums WNL.Marland Kitchen Moist mucosa without lesions . Neck supple and nontender. No palpable supraclavicular or  cervical adenopathy. Normal sized without goiter. Respiratory WNL. No retractions.. Cardiovascular Pedal Pulses WNL. her edema of the lower extremity persist and it is +2 pitting. Integumentary (Hair, Skin) The ulcerated area on  her left heel is minimal and is about 2 mm in depth and undermining.Marland Kitchen No crepitus or fluctuance. No peri-wound warmth or erythema. No masses.Marland Kitchen Psychiatric Judgement and insight Intact.. No evidence of depression, anxiety, or agitation.. Electronic Signature(s) Signed: 07/18/2014 4:25:54 PM By: Evlyn Kanner MD, FACS Entered By: Evlyn Kanner on 07/18/2014 15:05:45 NICHOEL, DIGIULIO ([id number]) -------------------------------------------------------------------------------- Physician Orders Details Patient Name: Tracy Le Date of Service: 07/18/2014 2:30 PM Medical Record Number: [id number] Patient Account Number: [id number] Date of Birth/Sex: 21-Jul-1913 (81 y.o. Female) Treating RN: Curtis Sites Primary Care Physician: Corky Downs Other Clinician: Referring Physician: Corky Downs Treating Physician/Extender: Rudene Re in Treatment: 19 Verbal / Phone Orders: Yes Clinician: Curtis Sites Read Back and Verified: Yes Diagnosis Coding Wound Cleansing Wound #1 Left Calcaneous o Clean wound with Normal Saline. Anesthetic Wound #1 Left Calcaneous o Topical Lidocaine 4% cream applied to wound bed prior to debridement Primary Wound Dressing Wound #1 Left Calcaneous o Iodoform packing Gauze Secondary Dressing Wound #1 Left Calcaneous o ABD and Kerlix/Conform Dressing Change Frequency Wound #1 Left Calcaneous o Change dressing every other day. Follow-up Appointments Wound #1 Left Calcaneous o Return Appointment in 1 week. Off-Loading Wound #1 Left Calcaneous o Turn and reposition every 2 hours Additional Orders / Instructions Wound #1 Left Calcaneous o Increase protein intake. o Activity as tolerated o Other: - sage  boots Le, Tracy ([id number]) Electronic Signature(s) Signed: 07/18/2014 4:25:54 PM By: Evlyn Kanner MD, FACS Signed: 07/18/2014 5:02:38 PM By: Curtis Sites Entered By: Curtis Sites on 07/18/2014 15:02:37 JAALA, BOHLE ([id number]) -------------------------------------------------------------------------------- Problem List Details Patient Name: Tracy Le Date of Service: 07/18/2014 2:30 PM Medical Record Number: [id number] Patient Account Number: [id number] Date of Birth/Sex: [birth year] (81 y.o. Female) Treating RN: Primary Care Physician: Corky Downs Other Clinician: Referring Physician: Corky Downs Treating Physician/Extender: Rudene Re in Treatment: 24 Active Problems ICD-10 Encounter Code Description Active Date Diagnosis L89.623 Pressure ulcer of left heel, stage 3 01/31/2014 Yes L89.312 Pressure ulcer of right buttock, stage 2 05/09/2014 Yes Inactive Problems Resolved Problems Electronic Signature(s) Signed: 07/18/2014 4:25:54 PM By: Evlyn Kanner MD, FACS Entered By: Evlyn Kanner on 07/18/2014 15:04:05 Tracy Le ([id number]) -------------------------------------------------------------------------------- Progress Note Details Patient Name: Tracy Le Date of Service: 07/18/2014 2:30 PM Medical Record Number: [id number] Patient Account Number: [id number] Date of Birth/Sex: 1913-12-30 (81 y.o. 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 She has no fresh complaints. she does not walk around much except transferring from a chair to the bed. History of Present Illness (HPI) The following HPI elements were documented for the patient's  wound: Location: left calcaneus Quality: Patient reports No Pain. Severity: Patient states wound (s) are getting better. Duration: Patient states that they are not certain how long the wound has been present. Timing: nil Context: The wound appeared gradually over time Modifying Factors: sitting most of the day Associated Signs and Symptoms: Patient reports having difficulty standing for long periods. The patient is an 81 year old female with a treatment history of a stage III left calcaneal wound. The patient is mostly bedbound at home. She does spend some time sitting up in a chair. She does not have an air mattress at home. Her caretaker has been using triple antibiotic on the bone itself. She denies any  fevers at home. The caretaker denies any history of diabetes or vascular disease. Since being seen here here in the wound clinic she has been applying Santyl to the ulcer base. the patient returns for follow-up today. They have been compliant with dressing changes. 03/29/14 -- the dressings have been done on a daily basis according to plan and the daughter says that she is running out of Santyl ointment and would need a new prescription. 04/26/2014 patient's daughter-in-law is comfortable doing the dressing and after discussing with her it is okay if the home health stops going to do the dressing once a week. 03/29/14 - she seems to be doing fine and has no fresh issues. The daughter has been doing her dressings on a regular basis and has been doing them according to the plan. 05/09/2014 the daughter noticed a small ulcer on the right buttock medially which is tiny and a stage II pressure ulcer. ILEANE, SANDO ([id number]) Objective Constitutional Pulse regular. Respirations normal and unlabored. Afebrile. Vitals Time Taken: 2:41 PM, Height: 69 in, Weight: 189 lbs, BMI: 27.9, Temperature: 98.4 F, Pulse: 82 bpm, Respiratory Rate: 18 breaths/min, Blood Pressure: 108/66 mmHg. Eyes Nonicteric.  Reactive to light. Ears, Nose, Mouth, and Throat Lips, teeth, and gums WNL.Marland Kitchen Moist mucosa without lesions . Neck supple and nontender. No palpable supraclavicular or cervical adenopathy. Normal sized without goiter. Respiratory WNL. No retractions.. Cardiovascular Pedal Pulses WNL. her edema of the lower extremity persist and it is +2 pitting. Psychiatric Judgement and insight Intact.. No evidence of depression, anxiety, or agitation.. Integumentary (Hair, Skin) The ulcerated area on her left heel is minimal and is about 2 mm in depth and undermining.Marland Kitchen No crepitus or fluctuance. No peri-wound warmth or erythema. No masses.. Wound #1 status is Open. Original cause of wound was Pressure Injury. The wound is located on the Left Calcaneous. The wound measures 0.2cm length x 0.3cm width x 0.6cm depth; 0.047cm^2 area and 0.028cm^3 volume. The wound is limited to skin breakdown. There is a none present amount of drainage noted. The wound margin is distinct with the outline attached to the wound base. There is large (67-100%) red granulation within the wound bed. There is a small (1-33%) amount of necrotic tissue within the wound bed including Adherent Slough. The periwound skin appearance exhibited: Moist. The periwound skin appearance did not exhibit: Callus, Crepitus, Excoriation, Fluctuance, Friable, Induration, Localized Edema, Rash, Scarring, Dry/Scaly, Maceration, Atrophie Blanche, Cyanosis, Ecchymosis, Hemosiderin Staining, Mottled, Pallor, Rubor, Erythema. Periwound temperature was noted as No Abnormality. AUDRIANA, ALDAMA ([id number]) Assessment Active Problems ICD-10 [phone] - Pressure ulcer of left heel, stage 3 L89.312 - Pressure ulcer of right buttock, stage 2 After the excision of all the callus and debris last week the wound is looking very good with no significant undermining or depth. We will continue local care and offloading as before and see her back next week. Plan Wound  Cleansing: Wound #1 Left Calcaneous: Clean wound with Normal Saline. Anesthetic: Wound #1 Left Calcaneous: Topical Lidocaine 4% cream applied to wound bed prior to debridement Primary Wound Dressing: Wound #1 Left Calcaneous: Iodoform packing Gauze Secondary Dressing: Wound #1 Left Calcaneous: ABD and Kerlix/Conform Dressing Change Frequency: Wound #1 Left Calcaneous: Change dressing every other day. Follow-up Appointments: Wound #1 Left Calcaneous: Return Appointment in 1 week. Off-Loading: Wound #1 Left Calcaneous: Turn and reposition every 2 hours Additional Orders / Instructions: Wound #1 Left Calcaneous: Increase protein intake. Activity as tolerated Other: - sage boots Nieblas, Aylin ([id number]) After the excision of  all the callus and debris last week the wound is looking very good with no significant undermining or depth. We will continue local care and offloading as before and see her back next week. Electronic Signature(s) Signed: 07/18/2014 4:25:54 PM By: Evlyn Kanner MD, FACS Entered By: Evlyn Kanner on 07/18/2014 15:07:57 MICHONNE, BETANZOS ([id number]) -------------------------------------------------------------------------------- SuperBill Details Patient Name: Tracy Le Date of Service: 07/18/2014 Medical Record Number: [id number] Patient Account Number: [id number] Date of Birth/Sex: Sep 15, 1913 (81 y.o. Female) Treating RN: Primary Care Physician: Corky Downs Other Clinician: Referring Physician: Corky Downs Treating Physician/Extender: Rudene Re in Treatment: 24 Diagnosis Coding ICD-10 Codes Code Description [id number] Pressure ulcer of left heel, stage 3 L89.312 Pressure ulcer of right buttock, stage 2 Facility Procedures CPT4 Code: [id number] Description: 99213 - WOUND CARE VISIT-LEV 3 EST PT Modifier: Quantity: 1 Physician Procedures CPT4 Code: [id number] Description: 99213 - WC PHYS LEVEL 3 - EST PT ICD-10 Description Diagnosis L89.623  Pressure ulcer of left heel, stage 3 Modifier: Quantity: 1 Electronic Signature(s) Signed: 07/18/2014 4:25:54 PM By: Evlyn Kanner MD, FACS Entered By: Evlyn Kanner on 07/18/2014 15:08:12

## 2014-07-19 NOTE — Progress Notes (Signed)
EVEA, SHEEK ([id number]) Visit Report for 07/18/2014 Arrival Information Details Patient Name: Tracy Le, Tracy Le Date of Service: 07/18/2014 2:30 PM Medical Record Number: [id number] Patient Account Number: [id number] Date of Birth/Sex: 1913-03-04 (81 y.o. Female) Treating RN: Huel Coventry Primary Care Physician: Corky Downs Other Clinician: Referring Physician: Corky Downs Treating Physician/Extender: Rudene Re in Treatment: 24 Visit Information History Since Last Visit Added or deleted any medications: No Patient Arrived: Wheel Chair Any new allergies or adverse reactions: No Arrival Time: 14:39 Had a fall or experienced change in No Accompanied By: daughter in activities of daily living that may affect law risk of falls: Transfer Assistance: Manual Signs or symptoms of abuse/neglect since last No Patient Identification Verified: Yes visito Secondary Verification Process Yes Hospitalized since last visit: No Completed: Has Dressing in Place as Prescribed: Yes Patient Has Alerts: Yes Pain Present Now: No Patient Alerts: 1/16 ABI L: 1.23 Electronic Signature(s) Signed: 07/18/2014 5:13:21 PM By: Elliot Gurney, RN, BSN, Kim RN, BSN Entered By: Elliot Gurney, RN, BSN, Kim on 07/18/2014 14:41:41 Tracy Le ([id number]) -------------------------------------------------------------------------------- Clinic Level of Care Assessment Details Patient Name: Tracy Le Date of Service: 07/18/2014 2:30 PM Medical Record Number: [id number] Patient Account Number: [id number] Date of Birth/Sex: Jun 25, 1913 (81 y.o. Female) Treating RN: Curtis Sites Primary Care Physician: Corky Downs Other Clinician: Referring Physician: Corky Downs Treating Physician/Extender: Rudene Re in Treatment: 24 Clinic Level of Care Assessment Items TOOL 4 Quantity Score  - Use when only an EandM is performed on FOLLOW-UP visit 0 ASSESSMENTS - Nursing Assessment / Reassessment X - Reassessment of  Co-morbidities (includes updates in patient status) 1 10 X - Reassessment of Adherence to Treatment Plan 1 5 ASSESSMENTS - Wound and Skin Assessment / Reassessment X - Simple Wound Assessment / Reassessment - one wound 1 5  - Complex Wound Assessment / Reassessment - multiple wounds 0  - Dermatologic / Skin Assessment (not related to wound area) 0 ASSESSMENTS - Focused Assessment X - Circumferential Edema Measurements - multi extremities 1 5  - Nutritional Assessment / Counseling / Intervention 0 X - Lower Extremity Assessment (monofilament, tuning fork, pulses) 1 5  - Peripheral Arterial Disease Assessment (using hand held doppler) 0 ASSESSMENTS - Ostomy and/or Continence Assessment and Care  - Incontinence Assessment and Management 0  - Ostomy Care Assessment and Management (repouching, etc.) 0 PROCESS - Coordination of Care X - Simple Patient / Family Education for ongoing care 1 15  - Complex (extensive) Patient / Family Education for ongoing care 0  - Staff obtains Chiropractor, Records, Test Results / Process Orders 0  - Staff telephones HHA, Nursing Homes / Clarify orders / etc 0  - Routine Transfer to another Facility (non-emergent condition) 0 Le, Tracy ([id number])  - Routine Hospital Admission (non-emergent condition) 0  - New Admissions / Manufacturing engineer / Ordering NPWT, Apligraf, etc. 0  - Emergency Hospital Admission (emergent condition) 0 X - Simple Discharge Coordination 1 10  - Complex (extensive) Discharge Coordination 0 PROCESS - Special Needs  - Pediatric / Minor Patient Management 0  - Isolation Patient Management 0  - Hearing / Language / Visual special needs 0  - Assessment of Community assistance (transportation, D/C planning, etc.) 0  - Additional assistance / Altered mentation 0  - Support Surface(s) Assessment (bed, cushion, seat, etc.) 0 INTERVENTIONS - Wound Cleansing / Measurement X - Simple Wound Cleansing  - one wound 1 5  - Complex Wound Cleansing - multiple wounds 0 X - Wound Imaging (photographs - any number  of wounds) 1 5  - Wound Tracing (instead of photographs) 0 X - Simple Wound Measurement - one wound 1 5  - Complex Wound Measurement - multiple wounds 0 INTERVENTIONS - Wound Dressings X - Small Wound Dressing one or multiple wounds 1 10  - Medium Wound Dressing one or multiple wounds 0  - Large Wound Dressing one or multiple wounds 0  - Application of Medications - topical 0  - Application of Medications - injection 0 INTERVENTIONS - Miscellaneous  - External ear exam 0 Tracy Le, Tracy Le ([id number])  - Specimen Collection (cultures, biopsies, blood, body fluids, etc.) 0  - Specimen(s) / Culture(s) sent or taken to Lab for analysis 0  - Patient Transfer (multiple staff / Michiel Sites Lift / Similar devices) 0  - Simple Staple / Suture removal (25 or less) 0  - Complex Staple / Suture removal (26 or more) 0  - Hypo / Hyperglycemic Management (close monitor of Blood Glucose) 0  - Ankle / Brachial Index (ABI) - do not check if billed separately 0 X - Vital Signs 1 5 Has the patient been seen at the hospital within the last three years: Yes Total Score: 85 Level Of Care: New/Established - Level 3 Electronic Signature(s) Signed: 07/18/2014 5:02:38 PM By: Curtis Sites Entered By: Curtis Sites on 07/18/2014 15:03:20 Tracy Le ([id number]) -------------------------------------------------------------------------------- Encounter Discharge Information Details Patient Name: Tracy Le Date of Service: 07/18/2014 2:30 PM Medical Record Number: [id number] Patient Account Number: [id number] Date of Birth/Sex: May 29, 1913 (81 y.o. Female) Treating RN: Primary Care Physician: Corky Downs Other Clinician: Referring Physician: Corky Downs Treating Physician/Extender: Rudene Re in Treatment: 24 Encounter Discharge Information Items Schedule Follow-up  Appointment: No Medication Reconciliation completed No and provided to Patient/Care Jamear Carbonneau: Provided on Clinical Summary of Care: 07/18/2014 Form Type Recipient Paper Patient LL Electronic Signature(s) Signed: 07/18/2014 3:07:32 PM By: Gwenlyn Perking Entered By: Gwenlyn Perking on 07/18/2014 15:07:32 Tracy Le ([id number]) -------------------------------------------------------------------------------- Lower Extremity Assessment Details Patient Name: Tracy Le Date of Service: 07/18/2014 2:30 PM Medical Record Number: [id number] Patient Account Number: [id number] Date of Birth/Sex: 1913/05/02 (81 y.o. Female) Treating RN: Huel Coventry Primary Care Physician: Corky Downs Other Clinician: Referring Physician: Corky Downs Treating Physician/Extender: Rudene Re in Treatment: 24 Vascular Assessment Pulses: Posterior Tibial Dorsalis Pedis Palpable: [Left:Yes] Extremity colors, hair growth, and conditions: Extremity Color: [Left:Mottled] Hair Growth on Extremity: [Left:No] Temperature of Extremity: [Left:Warm] Capillary Refill: [Left:< 3 seconds] Toe Nail Assessment Left: Right: Thick: Yes Discolored: Yes Deformed: Yes Improper Length and Hygiene: No Electronic Signature(s) Signed: 07/18/2014 5:13:21 PM By: Elliot Gurney, RN, BSN, Kim RN, BSN Entered By: Elliot Gurney, RN, BSN, Kim on 07/18/2014 14:48:28 Tracy Le ([id number]) -------------------------------------------------------------------------------- Multi Wound Chart Details Patient Name: Tracy Le Date of Service: 07/18/2014 2:30 PM Medical Record Number: [id number] Patient Account Number: [id number] Date of Birth/Sex: [birth year] (81 y.o. Female) Treating RN: Curtis Sites Primary Care Physician: Corky Downs Other Clinician: Referring Physician: Corky Downs Treating Physician/Extender: Rudene Re in Treatment: 24 Vital Signs Height(in): 69 Pulse(bpm): 82 Weight(lbs): 189 Blood  Pressure 108/66 (mmHg): Body Mass Index(BMI): 28 Temperature(F): 98.4 Respiratory Rate 18 (breaths/min): Photos: [1:No Photos] [N/A:N/A] Wound Location: [1:Left Calcaneous] [N/A:N/A] Wounding Event: [1:Pressure Injury] [N/A:N/A] Primary Etiology: [1:Pressure Ulcer] [N/A:N/A] Comorbid History: [1:History of pressure wounds, Osteoarthritis, Dementia] [N/A:N/A] Date Acquired: [1:12/31/2013] [N/A:N/A] Weeks of Treatment: [1:24] [N/A:N/A] Wound Status: [1:Open] [N/A:N/A] Measurements L x W x D 0.2x0.3x0.6 [N/A:N/A] (cm) Area (cm) : [1:0.047] [N/A:N/A] Volume (cm) : [1:0.028] [N/A:N/A] % Reduction in Area: [1:99.50%] [N/A:N/A] % Reduction  in Volume: 96.80% [N/A:N/A] Classification: [1:Category/Stage III] [N/A:N/A] Exudate Amount: [1:None Present] [N/A:N/A] Wound Margin: [1:Distinct, outline attached] [N/A:N/A] Granulation Amount: [1:Large (67-100%)] [N/A:N/A] Granulation Quality: [1:Red] [N/A:N/A] Necrotic Amount: [1:Small (1-33%)] [N/A:N/A] Exposed Structures: [1:Fascia: No Fat: No Tendon: No Muscle: No Joint: No Bone: No Limited to Skin Breakdown] [N/A:N/A] Epithelialization: Small (1-33%) N/A N/A Periwound Skin Texture: Edema: No N/A N/A Excoriation: No Induration: No Callus: No Crepitus: No Fluctuance: No Friable: No Rash: No Scarring: No Periwound Skin Moist: Yes N/A N/A Moisture: Maceration: No Dry/Scaly: No Periwound Skin Color: Atrophie Blanche: No N/A N/A Cyanosis: No Ecchymosis: No Erythema: No Hemosiderin Staining: No Mottled: No Pallor: No Rubor: No Temperature: No Abnormality N/A N/A Tenderness on No N/A N/A Palpation: Wound Preparation: Ulcer Cleansing: N/A N/A Rinsed/Irrigated with Saline Topical Anesthetic Applied: Other: Lidocaine 4% Ointment Treatment Notes Electronic Signature(s) Signed: 07/18/2014 5:02:38 PM By: Curtis Sites Entered By: Curtis Sites on 07/18/2014 15:02:10 Tracy Le, Tracy Le  ([id number]) -------------------------------------------------------------------------------- Multi-Disciplinary Care Plan Details Patient Name: Tracy Le Date of Service: 07/18/2014 2:30 PM Medical Record Number: [id number] Patient Account Number: [id number] Date of Birth/Sex: 10-01-1913 (81 y.o. Female) Treating RN: Curtis Sites Primary Care Physician: Corky Downs Other Clinician: Referring Physician: Corky Downs Treating Physician/Extender: Rudene Re in Treatment: 24 Active Inactive Abuse / Safety / Falls / Self Care Management Nursing Diagnoses: Potential for falls Goals: Patient will remain injury free Date Initiated: 01/31/2014 Goal Status: Active Interventions: Assess fall risk on admission and as needed Notes: Nutrition Nursing Diagnoses: Potential for alteratiion in Nutrition/Potential for imbalanced nutrition Goals: Patient/caregiver agrees to and verbalizes understanding of need to use nutritional supplements and/or vitamins as prescribed Date Initiated: 01/31/2014 Goal Status: Active Interventions: Assess patient nutrition upon admission and as needed per policy Notes: Pressure Nursing Diagnoses: Potential for impaired tissue integrity related to pressure, friction, moisture, and shear Goals: Patient will remain free from development of additional pressure ulcers Tracy Le, Tracy Le ([id number]) Date Initiated: 01/31/2014 Goal Status: Active Interventions: Assess potential for pressure ulcer upon admission and as needed Notes: Wound/Skin Impairment Nursing Diagnoses: Impaired tissue integrity Goals: Ulcer/skin breakdown will have a volume reduction of 30% by week 4 Date Initiated: 01/31/2014 Goal Status: Active Interventions: Assess ulceration(s) every visit Notes: Electronic Signature(s) Signed: 07/18/2014 5:02:38 PM By: Curtis Sites Entered By: Curtis Sites on 07/18/2014 15:01:59 Tracy Le  ([id number]) -------------------------------------------------------------------------------- Pain Assessment Details Patient Name: Tracy Le Date of Service: 07/18/2014 2:30 PM Medical Record Number: [id number] Patient Account Number: [id number] Date of Birth/Sex: 1913/08/17 (81 y.o. Female) Treating RN: Huel Coventry Primary Care Physician: Corky Downs Other Clinician: Referring Physician: Corky Downs Treating Physician/Extender: Rudene Re in Treatment: 24 Active Problems Location of Pain Severity and Description of Pain Patient Has Paino No Site Locations Pain Management and Medication Current Pain Management: Electronic Signature(s) Signed: 07/18/2014 5:13:21 PM By: Elliot Gurney, RN, BSN, Kim RN, BSN Entered By: Elliot Gurney, RN, BSN, Kim on 07/18/2014 14:41:47 Tracy Le ([id number]) -------------------------------------------------------------------------------- Wound Assessment Details Patient Name: Tracy Le Date of Service: 07/18/2014 2:30 PM Medical Record Number: [id number] Patient Account Number: [id number] Date of Birth/Sex: 1913-06-23 (81 y.o. Female) Treating RN: Huel Coventry Primary Care Physician: Corky Downs Other Clinician: Referring Physician: Corky Downs Treating Physician/Extender: Rudene Re in Treatment: 24 Wound Status Wound Number: 1 Primary Pressure Ulcer Etiology: Wound Location: Left Calcaneous Wound Open Wounding Event: Pressure Injury Status: Date Acquired: 12/31/2013 Comorbid History of pressure wounds, Weeks Of Treatment: 24 History: Osteoarthritis, Dementia Clustered Wound: No Photos Photo Uploaded By: Elliot Gurney, RN, BSN, Kim on 07/18/2014 15:12:42  Wound Measurements Length: (cm) 0.2 Width: (cm) 0.3 Depth: (cm) 0.6 Area: (cm) 0.047 Volume: (cm) 0.028 % Reduction in Area: 99.5% % Reduction in Volume: 96.8% Epithelialization: Small (1-33%) Wound Description Classification: Category/Stage III Wound Margin: Distinct, outline  attached Exudate Amount: None Present Foul Odor After Cleansing: No Wound Bed Granulation Amount: Large (67-100%) Exposed Structure Granulation Quality: Red Fascia Exposed: No Necrotic Amount: Small (1-33%) Fat Layer Exposed: No Necrotic Quality: Adherent Slough Tendon Exposed: No Muscle Exposed: No Joint Exposed: No Tracy Le, Tracy Le ([id number]) Bone Exposed: No Limited to Skin Breakdown Periwound Skin Texture Texture Color No Abnormalities Noted: No No Abnormalities Noted: No Callus: No Atrophie Blanche: No Crepitus: No Cyanosis: No Excoriation: No Ecchymosis: No Fluctuance: No Erythema: No Friable: No Hemosiderin Staining: No Induration: No Mottled: No Localized Edema: No Pallor: No Rash: No Rubor: No Scarring: No Temperature / Pain Moisture Temperature: No Abnormality No Abnormalities Noted: No Dry / Scaly: No Maceration: No Moist: Yes Wound Preparation Ulcer Cleansing: Rinsed/Irrigated with Saline Topical Anesthetic Applied: Other: Lidocaine 4% Ointment, Electronic Signature(s) Signed: 07/18/2014 5:13:21 PM By: Elliot Gurney, RN, BSN, Kim RN, BSN Entered By: Elliot Gurney, RN, BSN, Kim on 07/18/2014 14:49:18 Tracy Le ([id number]) -------------------------------------------------------------------------------- Vitals Details Patient Name: Tracy Le Date of Service: 07/18/2014 2:30 PM Medical Record Number: [id number] Patient Account Number: [id number] Date of Birth/Sex: 1913-06-03 (81 y.o. Female) Treating RN: Huel Coventry Primary Care Physician: Corky Downs Other Clinician: Referring Physician: Corky Downs Treating Physician/Extender: Rudene Re in Treatment: 24 Vital Signs Time Taken: 14:41 Temperature (F): 98.4 Height (in): 69 Pulse (bpm): 82 Weight (lbs): 189 Respiratory Rate (breaths/min): 18 Body Mass Index (BMI): 27.9 Blood Pressure (mmHg): 108/66 Reference Range: 80 - 120 mg / dl Electronic Signature(s) Signed: 07/18/2014 5:13:21 PM By:  Elliot Gurney, RN, BSN, Kim RN, BSN Entered By: Elliot Gurney, RN, BSN, Kim on 07/18/2014 14:43:01

## 2014-07-25 ENCOUNTER — Encounter: Payer: Medicare Other | Admitting: Surgery

## 2014-07-25 DIAGNOSIS — L89623 Pressure ulcer of left heel, stage 3: Secondary | ICD-10-CM | POA: Diagnosis not present

## 2014-07-26 NOTE — Progress Notes (Signed)
Tracy Le ([id number]) Visit Report for 07/25/2014 Arrival Information Details Patient Name: Tracy Le, Tracy Le Date of Service: 07/25/2014 1:45 PM Medical Record Number: [id number] Patient Account Number: [id number] Date of Birth/Sex: [birth year] (81 y.o. Female) Treating Le: Tracy Le Primary Care Physician: Tracy Le Other Clinician: Referring Physician: Corky Le Treating Physician/Extender: Tracy Le in Treatment: 25 Visit Information History Since Last Visit Added or deleted any medications: No Patient Arrived: Wheel Chair Any new allergies or adverse reactions: No Arrival Time: 13:53 Had a fall or experienced change in No Accompanied By: daughter in activities of daily living that may affect law risk of falls: Transfer Assistance: Manual Signs or symptoms of abuse/neglect since last No Patient Identification Verified: Yes visito Secondary Verification Process Yes Hospitalized since last visit: No Completed: Has Dressing in Place as Prescribed: Yes Patient Has Alerts: Yes Pain Present Now: No Patient Alerts: 1/16 ABI L: 1.23 Electronic Signature(s) Signed: 07/25/2014 5:55:26 PM By: Tracy Gurney, Le, BSN, Tracy Le, BSN Entered By: Tracy Gurney, Le, BSN, Tracy on 07/25/2014 13:54:00 Tracy Le ([id number]) -------------------------------------------------------------------------------- Clinic Level of Care Assessment Details Patient Name: Tracy Le Date of Service: 07/25/2014 1:45 PM Medical Record Number: [id number] Patient Account Number: [id number] Date of Birth/Sex: November 28, 1913 (81 y.o. Female) Treating Le: Tracy Le Primary Care Physician: Tracy Le Other Clinician: Referring Physician: Corky Le Treating Physician/Extender: Tracy Le in Treatment: 25 Clinic Level of Care Assessment Items TOOL 4 Quantity Score  - Use when only an EandM is performed on FOLLOW-UP visit 0 ASSESSMENTS - Nursing Assessment / Reassessment  - Reassessment of  Co-morbidities (includes updates in patient status) 0 X - Reassessment of Adherence to Treatment Plan 1 5 ASSESSMENTS - Wound and Skin Assessment / Reassessment X - Simple Wound Assessment / Reassessment - one wound 1 5  - Complex Wound Assessment / Reassessment - multiple wounds 0  - Dermatologic / Skin Assessment (not related to wound area) 0 ASSESSMENTS - Focused Assessment  - Circumferential Edema Measurements - multi extremities 0  - Nutritional Assessment / Counseling / Intervention 0  - Lower Extremity Assessment (monofilament, tuning fork, pulses) 0  - Peripheral Arterial Disease Assessment (using hand held doppler) 0 ASSESSMENTS - Ostomy and/or Continence Assessment and Care  - Incontinence Assessment and Management 0  - Ostomy Care Assessment and Management (repouching, etc.) 0 PROCESS - Coordination of Care X - Simple Patient / Family Education for ongoing care 1 15  - Complex (extensive) Patient / Family Education for ongoing care 0  - Staff obtains Chiropractor, Records, Test Results / Process Orders 0  - Staff telephones HHA, Nursing Homes / Clarify orders / etc 0  - Routine Transfer to another Facility (non-emergent condition) 0 Tracy Le ([id number])  - Routine Hospital Admission (non-emergent condition) 0  - New Admissions / Manufacturing engineer / Ordering NPWT, Apligraf, etc. 0  - Emergency Hospital Admission (emergent condition) 0 X - Simple Discharge Coordination 1 10  - Complex (extensive) Discharge Coordination 0 PROCESS - Special Needs  - Pediatric / Minor Patient Management 0  - Isolation Patient Management 0  - Hearing / Language / Visual special needs 0  - Assessment of Community assistance (transportation, D/C planning, etc.) 0  - Additional assistance / Altered mentation 0  - Support Surface(s) Assessment (bed, cushion, seat, etc.) 0 INTERVENTIONS - Wound Cleansing / Measurement X - Simple Wound Cleansing - one  wound 1 5  - Complex Wound Cleansing - multiple wounds 0 X - Wound Imaging (photographs - any number of wounds) 1  5 []  - Wound Tracing (instead of photographs) 0 X - Simple Wound Measurement - one wound 1 5 []  - Complex Wound Measurement - multiple wounds 0 INTERVENTIONS - Wound Dressings X - Small Wound Dressing one or multiple wounds 1 10 []  - Medium Wound Dressing one or multiple wounds 0 []  - Large Wound Dressing one or multiple wounds 0 []  - Application of Medications - topical 0 []  - Application of Medications - injection 0 INTERVENTIONS - Miscellaneous []  - External ear exam 0 Tracy Le, Tracy Le ([id number]) []  - Specimen Collection (cultures, biopsies, blood, body fluids, etc.) 0 []  - Specimen(s) / Culture(s) sent or taken to Lab for analysis 0 []  - Patient Transfer (multiple staff / Michiel SitesHoyer Lift / Similar devices) 0 []  - Simple Staple / Suture removal (25 or less) 0 []  - Complex Staple / Suture removal (26 or more) 0 []  - Hypo / Hyperglycemic Management (close monitor of Blood Glucose) 0 []  - Ankle / Brachial Index (ABI) - do not check if billed separately 0 X - Vital Signs 1 5 Has the patient been seen at the hospital within the last three years: Yes Total Score: 65 Level Of Care: New/Established - Level 2 Electronic Signature(s) Signed: 07/25/2014 5:55:26 PM By: Tracy GurneyWoody, Le, BSN, Tracy Le, BSN Entered By: Tracy Le on 07/25/2014 14:11:43 Tracy Le ([id number]) -------------------------------------------------------------------------------- Encounter Discharge Information Details Patient Name: Tracy Le Date of Service: 07/25/2014 1:45 PM Medical Record Number: [id number] Patient Account Number: [id number] Date of Birth/Sex: [birth year] (81 y.o. Female) Treating Le: Tracy CoventryWoody, Tracy Primary Care Physician: Tracy DownsMASOUD, JAVED Other Clinician: Referring Physician: Corky DownsMASOUD, JAVED Treating Physician/Extender: Tracy ReBritto, Errol Weeks in Treatment: 25 Encounter Discharge Information  Items Discharge Pain Level: 0 Discharge Condition: Stable Ambulatory Status: Wheelchair Discharge Destination: Home Transportation: Private Auto daughter in Accompanied By: law Schedule Follow-up Appointment: Yes Medication Reconciliation completed Yes and provided to Patient/Care Pearce [place]field: Clinical Summary of Care: Electronic Signature(s) Signed: 07/25/2014 5:55:26 PM By: Tracy GurneyWoody, Le, BSN, Tracy Le, BSN Entered By: Tracy Le on 07/25/2014 14:14:49 Tracy Le, Mouna ([id number]) -------------------------------------------------------------------------------- Lower Extremity Assessment Details Patient Name: Tracy Le Date of Service: 07/25/2014 1:45 PM Medical Record Number: [id number] Patient Account Number: [id number] Date of Birth/Sex: [birth year] (81 y.o. Female) Treating Le: Tracy CoventryWoody, Tracy Primary Care Physician: Tracy DownsMASOUD, JAVED Other Clinician: Referring Physician: Corky DownsMASOUD, JAVED Treating Physician/Extender: Tracy ReBritto, Errol Weeks in Treatment: 25 Vascular Assessment Pulses: Posterior Tibial Dorsalis Pedis Palpable: [Left:Yes] Extremity colors, hair growth, and conditions: Extremity Color: [Left:Mottled] Hair Growth on Extremity: [Left:No] Temperature of Extremity: [Left:Warm] Capillary Refill: [Left:< 3 seconds] Toe Nail Assessment Left: Right: Thick: Yes Discolored: Yes Deformed: Yes Improper Length and Hygiene: Yes Electronic Signature(s) Signed: 07/25/2014 5:55:26 PM By: Tracy GurneyWoody, Le, BSN, Tracy Le, BSN Entered By: Tracy Le on 07/25/2014 14:02:23 Tracy Le ([id number]) -------------------------------------------------------------------------------- Multi Wound Chart Details Patient Name: Tracy Le, Razia Date of Service: 07/25/2014 1:45 PM Medical Record Number: [id number] Patient Account Number: [id number] Date of Birth/Sex: [birth year] (81 y.o. Female) Treating Le: Tracy CoventryWoody, Tracy Primary Care Physician: Tracy DownsMASOUD, JAVED Other Clinician: Referring Physician: Corky DownsMASOUD,  JAVED Treating Physician/Extender: Tracy ReBritto, Errol Weeks in Treatment: 25 Vital Signs Height(in): 69 Pulse(bpm): 62 Weight(lbs): 189 Blood Pressure 136/63 (mmHg): Body Mass Index(BMI): 28 Temperature(F): 98.3 Respiratory Rate 18 (breaths/min): Photos: [1:No Photos] [N/A:N/A] Wound Location: [1:Left Calcaneous] [N/A:N/A] Wounding Event: [1:Pressure Injury] [N/A:N/A] Primary Etiology: [1:Pressure Ulcer] [N/A:N/A] Comorbid History: [1:History of pressure wounds, Osteoarthritis, Dementia] [N/A:N/A] Date Acquired: [1:12/31/2013] [N/A:N/A] Weeks of Treatment: [1:25] [N/A:N/A] Wound Status: [1:Open] [N/A:N/A] Measurements L x W  x D 0.2x0.3x0.3 [N/A:N/A] (cm) Area (cm) : [1:0.047] [N/A:N/A] Volume (cm) : [1:0.014] [N/A:N/A] % Reduction in Area: [1:99.50%] [N/A:N/A] % Reduction in Volume: 98.40% [N/A:N/A] Classification: [1:Category/Stage III] [N/A:N/A] Exudate Amount: [1:None Present] [N/A:N/A] Wound Margin: [1:Distinct, outline attached] [N/A:N/A] Granulation Amount: [1:Large (67-100%)] [N/A:N/A] Granulation Quality: [1:Red] [N/A:N/A] Necrotic Amount: [1:Small (1-33%)] [N/A:N/A] Exposed Structures: [1:Fascia: No Fat: No Tendon: No Muscle: No Joint: No Bone: No Limited to Skin Breakdown] [N/A:N/A] Epithelialization: Small (1-33%) N/A N/A Periwound Skin Texture: Callus: Yes N/A N/A Edema: No Excoriation: No Induration: No Crepitus: No Fluctuance: No Friable: No Rash: No Scarring: No Periwound Skin Moist: Yes N/A N/A Moisture: Maceration: No Dry/Scaly: No Periwound Skin Color: Atrophie Blanche: No N/A N/A Cyanosis: No Ecchymosis: No Erythema: No Hemosiderin Staining: No Mottled: No Pallor: No Rubor: No Temperature: No Abnormality N/A N/A Tenderness on No N/A N/A Palpation: Wound Preparation: Ulcer Cleansing: N/A N/A Rinsed/Irrigated with Saline Topical Anesthetic Applied: Other: Lidocaine 4% Ointment Treatment Notes Electronic Signature(s) Signed:  07/25/2014 5:55:26 PM By: Tracy Gurney, Le, BSN, Tracy Le, BSN Entered By: Tracy Gurney, Le, BSN, Tracy on 07/25/2014 14:10:34 Tracy Le, Tracy Le ([id number]) -------------------------------------------------------------------------------- Multi-Disciplinary Care Plan Details Patient Name: Tracy Le Date of Service: 07/25/2014 1:45 PM Medical Record Number: [id number] Patient Account Number: [id number] Date of Birth/Sex: 04-Jul-1913 (81 y.o. Female) Treating Le: Tracy Le Primary Care Physician: Tracy Le Other Clinician: Referring Physician: Corky Le Treating Physician/Extender: Tracy Le in Treatment: 25 Active Inactive Abuse / Safety / Falls / Self Care Management Nursing Diagnoses: Potential for falls Goals: Patient will remain injury free Date Initiated: 01/31/2014 Goal Status: Active Interventions: Assess fall risk on admission and as needed Notes: Nutrition Nursing Diagnoses: Potential for alteratiion in Nutrition/Potential for imbalanced nutrition Goals: Patient/caregiver agrees to and verbalizes understanding of need to use nutritional supplements and/or vitamins as prescribed Date Initiated: 01/31/2014 Goal Status: Active Interventions: Assess patient nutrition upon admission and as needed per policy Notes: Pressure Nursing Diagnoses: Potential for impaired tissue integrity related to pressure, friction, moisture, and shear Goals: Patient will remain free from development of additional pressure ulcers Tracy Le, Tracy Le ([id number]) Date Initiated: 01/31/2014 Goal Status: Active Interventions: Assess potential for pressure ulcer upon admission and as needed Notes: Wound/Skin Impairment Nursing Diagnoses: Impaired tissue integrity Goals: Ulcer/skin breakdown will have a volume reduction of 30% by week 4 Date Initiated: 01/31/2014 Goal Status: Active Interventions: Assess ulceration(s) every visit Notes: Electronic Signature(s) Signed: 07/25/2014 5:55:26 PM By: Tracy Gurney, Le,  BSN, Tracy Le, BSN Entered By: Tracy Gurney, Le, BSN, Tracy on 07/25/2014 14:10:26 Tracy Le ([id number]) -------------------------------------------------------------------------------- Pain Assessment Details Patient Name: Tracy Le Date of Service: 07/25/2014 1:45 PM Medical Record Number: [id number] Patient Account Number: [id number] Date of Birth/Sex: 1913/09/13 (81 y.o. Female) Treating Le: Tracy Le Primary Care Physician: Tracy Le Other Clinician: Referring Physician: Corky Le Treating Physician/Extender: Tracy Le in Treatment: 25 Active Problems Location of Pain Severity and Description of Pain Patient Has Paino No Site Locations Pain Management and Medication Current Pain Management: Electronic Signature(s) Signed: 07/25/2014 5:55:26 PM By: Tracy Gurney, Le, BSN, Tracy Le, BSN Entered By: Tracy Gurney, Le, BSN, Tracy on 07/25/2014 13:54:09 Tracy Le ([id number]) -------------------------------------------------------------------------------- Patient/Caregiver Education Details Patient Name: Tracy Le Date of Service: 07/25/2014 1:45 PM Medical Record Number: [id number] Patient Account Number: [id number] Date of Birth/Gender: 03-06-1913 (81 y.o. Female) Treating Le: Tracy Le Primary Care Physician: Tracy Le Other Clinician: Referring Physician: Corky Le Treating Physician/Extender: Tracy Le in Treatment: 25 Education Assessment Education Provided To: Patient Education Topics Provided Wound/Skin Impairment: Handouts: Other: wound  care as prescribed Electronic Signature(s) Signed: 07/25/2014 5:55:26 PM By: Tracy Gurney Le, BSN, Tracy Le, BSN Entered By: Tracy Gurney, Le, BSN, Tracy on 07/25/2014 14:15:20 Tracy Le, Tracy Le ([id number]) -------------------------------------------------------------------------------- Wound Assessment Details Patient Name: Tracy Le Date of Service: 07/25/2014 1:45 PM Medical Record Number: [id number] Patient Account Number:  [id number] Date of Birth/Sex: 08-01-1913 (81 y.o. Female) Treating Le: Tracy Le Primary Care Physician: Tracy Le Other Clinician: Referring Physician: Corky Le Treating Physician/Extender: Tracy Le in Treatment: 25 Wound Status Wound Number: 1 Primary Pressure Ulcer Etiology: Wound Location: Left Calcaneous Wound Open Wounding Event: Pressure Injury Status: Date Acquired: 12/31/2013 Comorbid History of pressure wounds, Weeks Of Treatment: 25 History: Osteoarthritis, Dementia Clustered Wound: No Photos Photo Uploaded By: Tracy Gurney, Le, BSN, Tracy on 07/25/2014 14:21:19 Wound Measurements Length: (cm) 0.2 Width: (cm) 0.3 Depth: (cm) 0.3 Area: (cm) 0.047 Volume: (cm) 0.014 % Reduction in Area: 99.5% % Reduction in Volume: 98.4% Epithelialization: Small (1-33%) Wound Description Classification: Category/Stage III Wound Margin: Distinct, outline attached Exudate Amount: None Present Foul Odor After Cleansing: No Wound Bed Granulation Amount: Large (67-100%) Exposed Structure Granulation Quality: Red Fascia Exposed: No Necrotic Amount: Small (1-33%) Fat Layer Exposed: No Necrotic Quality: Adherent Slough Tendon Exposed: No Muscle Exposed: No Joint Exposed: No Tracy Le, Tracy Le ([id number]) Bone Exposed: No Limited to Skin Breakdown Periwound Skin Texture Texture Color No Abnormalities Noted: No No Abnormalities Noted: No Callus: Yes Atrophie Blanche: No Crepitus: No Cyanosis: No Excoriation: No Ecchymosis: No Fluctuance: No Erythema: No Friable: No Hemosiderin Staining: No Induration: No Mottled: No Localized Edema: No Pallor: No Rash: No Rubor: No Scarring: No Temperature / Pain Moisture Temperature: No Abnormality No Abnormalities Noted: No Dry / Scaly: No Maceration: No Moist: Yes Wound Preparation Ulcer Cleansing: Rinsed/Irrigated with Saline Topical Anesthetic Applied: Other: Lidocaine 4% Ointment, Treatment Notes Wound #1  (Left Calcaneous) 1. Cleansed with: Clean wound with Normal Saline 2. Anesthetic Topical Lidocaine 4% cream to wound bed prior to debridement 4. Dressing Applied: Iodoform packing Gauze 5. Secondary Dressing Applied Bordered Foam Dressing Electronic Signature(s) Signed: 07/25/2014 5:55:26 PM By: Tracy Gurney, Le, BSN, Tracy Le, BSN Entered By: Tracy Gurney, Le, BSN, Tracy on 07/25/2014 14:01:24 Tracy Le, Tracy Le ([id number]) -------------------------------------------------------------------------------- Vitals Details Patient Name: Tracy Le Date of Service: 07/25/2014 1:45 PM Medical Record Number: [id number] Patient Account Number: [id number] Date of Birth/Sex: July 19, 1913 (81 y.o. Female) Treating Le: Tracy Le Primary Care Physician: Tracy Le Other Clinician: Referring Physician: Corky Le Treating Physician/Extender: Tracy Le in Treatment: 25 Vital Signs Time Taken: 13:54 Temperature (F): 98.3 Height (in): 69 Pulse (bpm): 62 Weight (lbs): 189 Respiratory Rate (breaths/min): 18 Body Mass Index (BMI): 27.9 Blood Pressure (mmHg): 136/63 Reference Range: 80 - 120 mg / dl Electronic Signature(s) Signed: 07/25/2014 5:55:26 PM By: Tracy Gurney, Le, BSN, Tracy Le, BSN Entered By: Tracy Gurney, Le, BSN, Tracy on 07/25/2014 13:55:20

## 2014-07-26 NOTE — Progress Notes (Signed)
Tracy Le, Tracy Le ([id number]) Visit Report for 07/25/2014 Chief Complaint Document Details Patient Name: Tracy Le, Tracy Le Date of Service: 07/25/2014 1:45 PM Medical Record Number: [id number] Patient Account Number: [id number] Date of Birth/Sex: September 23, 1913 (81 y.o. 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 She has no fresh complaints. she does not walk around much except transferring from a chair to the bed. Electronic Signature(s) Signed: 07/25/2014 4:47:08 PM By: Tracy Kanner MD, FACS Entered By: Tracy Le on 07/25/2014 14:15:24 Tracy Le, Tracy Le ([id number]) -------------------------------------------------------------------------------- HPI Details Patient Name: Tracy Le Date of Service: 07/25/2014 1:45 PM Medical Record Number: [id number] Patient Account Number: [id number] Date of Birth/Sex: [birth year] (81 y.o. Female) Treating RN: Primary Care Physician: Tracy Le Other Clinician: Referring Physician: Corky Le Treating Physician/Extender: Tracy Le in Treatment: 25 History of Present Illness Location: left calcaneus Quality: Patient reports No Pain. Severity: Patient states wound (s) are getting better. Duration: Patient states that they are not certain how long the wound has been present. Timing: nil Context: The wound appeared gradually over time Modifying Factors: sitting most of the day Associated Signs and Symptoms: Patient reports having difficulty standing for long periods. HPI Description: The patient is an 81 year old female with a treatment history of a stage III left calcaneal wound. The patient is mostly bedbound at home. She does spend some time sitting  up in a chair. She does not have an air mattress at home. Her caretaker has been using triple antibiotic on the bone itself. She denies any fevers at home. The caretaker denies any history of diabetes or vascular disease. Since being seen here here in the wound clinic she has been applying Santyl to the ulcer base. the patient returns for follow-up today. They have been compliant with dressing changes. 03/29/14 -- the dressings have been done on a daily basis according to plan and the daughter says that she is running out of Santyl ointment and would need a new prescription. 04/26/2014 patient's daughter-in-law is comfortable doing the dressing and after discussing with her it is okay if the home health stops going to do the dressing once a week. 03/29/14 - she seems to be doing fine and has no fresh issues. The daughter has been doing her dressings on a regular basis and has been doing them according to the plan. 05/09/2014 the daughter noticed a small ulcer on the right buttock medially which is tiny and a stage II pressure ulcer. Electronic Signature(s) Signed: 07/25/2014 4:47:08 PM By: Tracy Kanner MD, FACS Entered By: Tracy Le on 07/25/2014 14:15:30 Tracy Le ([id number]) -------------------------------------------------------------------------------- Physical Exam Details Patient Name: Tracy Le Date of Service: 07/25/2014 1:45 PM Medical Record Number: [id number] Patient Account Number: [id number] Date of Birth/Sex: [birth year] (81 y.o. Female) Treating RN: Primary Care Physician: Tracy Le Other Clinician: Referring Physician: Corky Le Treating Physician/Extender: Tracy Le in Treatment: 25 Constitutional . Pulse regular. Respirations normal and unlabored. Afebrile. . Eyes Nonicteric. Reactive to light. Ears, Nose, Mouth, and Throat Lips, teeth, and gums WNL.Marland Kitchen Moist mucosa without lesions . Neck supple and nontender. No palpable supraclavicular or  cervical adenopathy. Normal sized without goiter. Respiratory WNL. No retractions.. Cardiovascular Pedal Pulses WNL. No clubbing, cyanosis or edema. Musculoskeletal Adexa without tenderness or enlargement.. Digits and nails w/o clubbing, cyanosis, infection, petechiae,  ischemia, or inflammatory conditions.. Integumentary (Hair, Skin) wound is healing nicely with a tiny opening and a sinus track about 2 mm x 3 mm in depth.. No crepitus or fluctuance. No peri-wound warmth or erythema. No masses.Marland Kitchen Psychiatric Judgement and insight Intact.. No evidence of depression, anxiety, or agitation.. Electronic Signature(s) Signed: 07/25/2014 4:47:08 PM By: Tracy Kanner MD, FACS Entered By: Tracy Le on 07/25/2014 14:16:14 Tracy Le, Tracy Le ([id number]) -------------------------------------------------------------------------------- Physician Orders Details Patient Name: Tracy Le Date of Service: 07/25/2014 1:45 PM Medical Record Number: [id number] Patient Account Number: [id number] Date of Birth/Sex: [birth year] (81 y.o. Female) Treating RN: Tracy Le Primary Care Physician: Tracy Le Other Clinician: Referring Physician: Corky Le Treating Physician/Extender: Tracy Le in Treatment: 25 Verbal / Phone Orders: Yes Clinician: Huel Le Read Back and Verified: Yes Diagnosis Coding Wound Cleansing Wound #1 Left Calcaneous o Clean wound with Normal Saline. Anesthetic Wound #1 Left Calcaneous o Topical Lidocaine 4% cream applied to wound bed prior to debridement Primary Wound Dressing Wound #1 Left Calcaneous o Iodoform packing Gauze Secondary Dressing Wound #1 Left Calcaneous o ABD and Kerlix/Conform Dressing Change Frequency Wound #1 Left Calcaneous o Change dressing every other day. Follow-up Appointments Wound #1 Left Calcaneous o Return Appointment in 1 week. Off-Loading Wound #1 Left Calcaneous o Turn and reposition every 2 hours Additional  Orders / Instructions Wound #1 Left Calcaneous o Increase protein intake. o Activity as tolerated o Other: - sage boots Tracy Le, Tracy Le ([id number]) Electronic Signature(s) Signed: 07/25/2014 4:47:08 PM By: Tracy Kanner MD, FACS Signed: 07/25/2014 5:55:26 PM By: Elliot Gurney RN, BSN, Kim RN, BSN Entered By: Elliot Gurney, RN, BSN, Kim on 07/25/2014 14:10:58 Tracy Le ([id number]) -------------------------------------------------------------------------------- Problem List Details Patient Name: Tracy Le Date of Service: 07/25/2014 1:45 PM Medical Record Number: [id number] Patient Account Number: [id number] Date of Birth/Sex: 1913-08-06 (81 y.o. Female) Treating RN: Primary Care Physician: Tracy Le Other Clinician: Referring Physician: Corky Le Treating Physician/Extender: Tracy Le in Treatment: 25 Active Problems ICD-10 Encounter Code Description Active Date Diagnosis L89.623 Pressure ulcer of left heel, stage 3 01/31/2014 Yes L89.312 Pressure ulcer of right buttock, stage 2 05/09/2014 Yes Inactive Problems Resolved Problems Electronic Signature(s) Signed: 07/25/2014 4:47:08 PM By: Tracy Kanner MD, FACS Entered By: Tracy Le on 07/25/2014 14:13:01 Tracy Le ([id number]) -------------------------------------------------------------------------------- Progress Note Details Patient Name: Tracy Le Date of Service: 07/25/2014 1:45 PM Medical Record Number: [id number] Patient Account Number: [id number] Date of Birth/Sex: January 11, 1914 (81 y.o. 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 She has no fresh complaints. she does not walk around much except  transferring from a chair to the bed. History of Present Illness (HPI) The following HPI elements were documented for the patient's wound: Location: left calcaneus Quality: Patient reports No Pain. Severity: Patient states wound (s) are getting better. Duration: Patient states that they are not certain how long the wound has been present. Timing: nil Context: The wound appeared gradually over time Modifying Factors: sitting most of the day Associated Signs and Symptoms: Patient reports having difficulty standing for long periods. The patient is an 81 year old female with a treatment history of a stage III left calcaneal wound. The patient is mostly bedbound at home. She does spend some time sitting up in a chair. She does not have  an air mattress at home. Her caretaker has been using triple antibiotic on the bone itself. She denies any fevers at home. The caretaker denies any history of diabetes or vascular disease. Since being seen here here in the wound clinic she has been applying Santyl to the ulcer base. the patient returns for follow-up today. They have been compliant with dressing changes. 03/29/14 -- the dressings have been done on a daily basis according to plan and the daughter says that she is running out of Santyl ointment and would need a new prescription. 04/26/2014 patient's daughter-in-law is comfortable doing the dressing and after discussing with her it is okay if the home health stops going to do the dressing once a week. 03/29/14 - she seems to be doing fine and has no fresh issues. The daughter has been doing her dressings on a regular basis and has been doing them according to the plan. 05/09/2014 the daughter noticed a small ulcer on the right buttock medially which is tiny and a stage II pressure ulcer. Tracy Le, Tracy Le ([id number]) Objective Constitutional Pulse regular. Respirations normal and unlabored. Afebrile. Vitals Time Taken: 1:54 PM, Height: 69 in, Weight: 189  lbs, BMI: 27.9, Temperature: 98.3 F, Pulse: 62 bpm, Respiratory Rate: 18 breaths/min, Blood Pressure: 136/63 mmHg. Eyes Nonicteric. Reactive to light. Ears, Nose, Mouth, and Throat Lips, teeth, and gums WNL.Marland Kitchen Moist mucosa without lesions . Neck supple and nontender. No palpable supraclavicular or cervical adenopathy. Normal sized without goiter. Respiratory WNL. No retractions.. Cardiovascular Pedal Pulses WNL. No clubbing, cyanosis or edema. Musculoskeletal Adexa without tenderness or enlargement.. Digits and nails w/o clubbing, cyanosis, infection, petechiae, ischemia, or inflammatory conditions.Marland Kitchen Psychiatric Judgement and insight Intact.. No evidence of depression, anxiety, or agitation.. Integumentary (Hair, Skin) wound is healing nicely with a tiny opening and a sinus track about 2 mm x 3 mm in depth.. No crepitus or fluctuance. No peri-wound warmth or erythema. No masses.. Wound #1 status is Open. Original cause of wound was Pressure Injury. The wound is located on the Left Calcaneous. The wound measures 0.2cm length x 0.3cm width x 0.3cm depth; 0.047cm^2 area and 0.014cm^3 volume. The wound is limited to skin breakdown. There is a none present amount of drainage noted. The wound margin is distinct with the outline attached to the wound base. There is large (67-100%) red granulation within the wound bed. There is a small (1-33%) amount of necrotic tissue within the wound bed including Adherent Slough. The periwound skin appearance exhibited: Callus, Moist. The periwound skin appearance did not exhibit: Crepitus, Excoriation, Fluctuance, Friable, Induration, Localized Edema, Rash, Scarring, Dry/Scaly, Maceration, Atrophie Blanche, Cyanosis, Ecchymosis, Hemosiderin Staining, Mottled, Pallor, Rubor, Erythema. Periwound temperature was noted as No Abnormality. Tracy Le, Tracy Le ([id number]) Assessment Active Problems ICD-10 [phone] - Pressure ulcer of left heel, stage 3 L89.312 -  Pressure ulcer of right buttock, stage 2 Her left heel decubitus ulcer is healed very nicely and there is a minimal sinus tract left. I will asked her to lightly apply some iodoform gauze and use heel protectors for this area. Anticipate by next week it may be completely healed. Plan Wound Cleansing: Wound #1 Left Calcaneous: Clean wound with Normal Saline. Anesthetic: Wound #1 Left Calcaneous: Topical Lidocaine 4% cream applied to wound bed prior to debridement Primary Wound Dressing: Wound #1 Left Calcaneous: Iodoform packing Gauze Secondary Dressing: Wound #1 Left Calcaneous: ABD and Kerlix/Conform Dressing Change Frequency: Wound #1 Left Calcaneous: Change dressing every other day. Follow-up Appointments: Wound #1 Left Calcaneous: Return Appointment in 1  week. Off-Loading: Wound #1 Left Calcaneous: Turn and reposition every 2 hours Additional Orders / Instructions: Wound #1 Left Calcaneous: Increase protein intake. Activity as tolerated Other: - sage boots Tracy Le, Tracy Le ([id number]) Her left heel decubitus ulcer is healed very nicely and there is a minimal sinus tract left. I will asked her to lightly apply some iodoform gauze and use heel protectors for this area. Anticipate by next week it may be completely healed. Electronic Signature(s) Signed: 07/25/2014 4:47:08 PM By: Tracy KannerBritto, Ariany Kesselman MD, FACS Entered By: Tracy KannerBritto, Abdulai Blaylock on 07/25/2014 14:17:20 Tracy OsierLLOYD, Tracy Le ([id number]) -------------------------------------------------------------------------------- SuperBill Details Patient Name: Tracy OsierLLOYD, Tracy Le Date of Service: 07/25/2014 Medical Record Number: [id number] Patient Account Number: [id number] Date of Birth/Sex: December 23, 1913 (81 y.o. Female) Treating RN: Primary Care Physician: Tracy DownsMASOUD, JAVED Other Clinician: Referring Physician: Corky DownsMASOUD, JAVED Treating Physician/Extender: Tracy ReBritto, Horatio Bertz Weeks in Treatment: 25 Diagnosis Coding ICD-10 Codes Code Description [phone] [id number] Pressure ulcer  of left heel, stage 3 L89.312 Pressure ulcer of right buttock, stage 2 Facility Procedures CPT4 Code: [id number] Description: [id number] - WOUND CARE VISIT-LEV 2 EST PT Modifier: Quantity: 1 Physician Procedures CPT4 Code: [id number] Description: 99213 - WC PHYS LEVEL 3 - EST PT ICD-10 Description Diagnosis L89.623 Pressure ulcer of left heel, stage 3 Modifier: Quantity: 1 Electronic Signature(s) Signed: 07/25/2014 4:47:08 PM By: Tracy KannerBritto, Hansen Carino MD, FACS Entered By: Tracy KannerBritto, Bartlomiej Jenkinson on 07/25/2014 14:17:43

## 2014-08-02 ENCOUNTER — Encounter: Payer: Medicare Other | Attending: Surgery | Admitting: Surgery

## 2014-08-02 DIAGNOSIS — L89623 Pressure ulcer of left heel, stage 3: Secondary | ICD-10-CM | POA: Insufficient documentation

## 2014-08-02 DIAGNOSIS — L89312 Pressure ulcer of right buttock, stage 2: Secondary | ICD-10-CM | POA: Insufficient documentation

## 2014-08-02 NOTE — Progress Notes (Addendum)
Eduardo OsierLLOYD, Toula ([id number]) Visit Report for 08/02/2014 Arrival Information Details Patient Name: Eduardo OsierLLOYD, Takima Date of Service: 08/02/2014 1:45 PM Medical Record Patient Account Number: [id number] Number: Afful, RN, BSN, Treating RN: 06-24-1913 (81 y.o. [place] Date of Birth/Sex: Female) Other Clinician: Primary Care Physician: Juel BurrowMASOUD, JAVED Treating Evlyn KannerBritto, Errol Referring Physician: Corky DownsMASOUD, JAVED Physician/Extender: Weeks in Treatment: 26 Visit Information History Since Last Visit Any new allergies or adverse reactions: No Patient Arrived: Wheel Chair Had a fall or experienced change in No Arrival Time: 13:57 activities of daily living that may affect Accompanied By: dtr risk of falls: Transfer Assistance: EasyPivot Patient Signs or symptoms of abuse/neglect since last No Lift visito Patient Identification Verified: Yes Has Dressing in Place as Prescribed: Yes Secondary Verification Process Yes Pain Present Now: No Completed: Patient Has Alerts: Yes Patient Alerts: 1/16 ABI L: 1.23 Electronic Signature(s) Signed: 08/02/2014 1:58:30 PM By: Elpidio EricAfful, Rita BSN, RN Entered By: Elpidio EricAfful, Rita on 08/02/2014 13:58:30 Eduardo OsierLLOYD, Nahiara ([id number]) -------------------------------------------------------------------------------- Encounter Discharge Information Details Patient Name: Eduardo OsierLLOYD, Sharlyn Date of Service: 08/02/2014 1:45 PM Medical Record Patient Account Number: [id number] Number: Afful, RN, BSN, Treating RN: 06-24-1913 (81 y.o. [place] Date of Birth/Sex: Female) Other Clinician: Primary Care Physician: MASOUD, JAVED Treating Britto, Errol Referring Physician: Corky DownsMASOUD, JAVED Physician/Extender: Weeks in Treatment: 26 Encounter Discharge Information Items Discharge Pain Level: 0 Discharge Condition: Stable Ambulatory Status: Wheelchair Discharge Destination: Home Private Transportation: Auto Accompanied By: dtr Schedule Follow-up Appointment: No Medication  Reconciliation completed and No provided to Patient/Care Solomon Skowronek: Clinical Summary of Care: Electronic Signature(s) Signed: 08/02/2014 2:16:03 PM By: Elpidio EricAfful, Rita BSN, RN Entered By: Elpidio EricAfful, Rita on 08/02/2014 14:16:03 Eduardo OsierLLOYD, Roxann ([id number]) -------------------------------------------------------------------------------- Lower Extremity Assessment Details Patient Name: Eduardo OsierLLOYD, Marisella Date of Service: 08/02/2014 1:45 PM Medical Record Patient Account Number: [id number] Number: Afful, RN, BSN, Treating RN: 06-24-1913 (81 y.o. [place] Date of Birth/Sex: Female) Other Clinician: Primary Care Physician: Juel BurrowMASOUD, JAVED Treating Britto, Errol Referring Physician: Corky DownsMASOUD, JAVED Physician/Extender: Weeks in Treatment: 26 Vascular Assessment Pulses: Posterior Tibial Dorsalis Pedis Palpable: [Left:Yes] Extremity colors, hair growth, and conditions: Extremity Color: [Left:Normal] Hair Growth on Extremity: [Left:No] Temperature of Extremity: [Left:Warm] Capillary Refill: [Left:< 3 seconds] Dependent Rubor: [Left:No] Blanched when Elevated: [Left:No] Lipodermatosclerosis: [Left:No] Toe Nail Assessment Left: Right: Thick: Yes Discolored: Yes Deformed: Yes Improper Length and Hygiene: Yes Electronic Signature(s) Signed: 08/02/2014 2:04:05 PM By: Elpidio EricAfful, Rita BSN, RN Entered By: Elpidio EricAfful, Rita on 08/02/2014 14:04:04 Eduardo OsierLLOYD, Anjali ([id number]) -------------------------------------------------------------------------------- Multi-Disciplinary Care Plan Details Patient Name: Eduardo OsierLLOYD, Melenie Date of Service: 08/02/2014 1:45 PM Medical Record Patient Account Number: [id number] Number: Afful, RN, BSN, Treating RN: 06-24-1913 (81 y.o. [place] Date of Birth/Sex: Female) Other Clinician: Primary Care Physician: Juel BurrowMASOUD, JAVED Treating Evlyn KannerBritto, Errol Referring Physician: Corky DownsMASOUD, JAVED Physician/Extender: Tania AdeWeeks in Treatment: 26 Active Inactive Electronic Signature(s) Signed: 08/02/2014 2:10:53 PM  By: Elpidio EricAfful, Rita BSN, RN Previous Signature: 08/02/2014 2:07:27 PM Version By: Elpidio EricAfful, Rita BSN, RN Entered By: Elpidio EricAfful, Rita on 08/02/2014 14:10:53 Eduardo OsierLLOYD, Llesenia ([id number]) -------------------------------------------------------------------------------- Pain Assessment Details Patient Name: Eduardo OsierLLOYD, Nea Date of Service: 08/02/2014 1:45 PM Medical Record Patient Account Number: [id number] Number: Afful, RN, BSN, Treating RN: 06-24-1913 (81 y.o. [place] Date of Birth/Sex: Female) Other Clinician: Primary Care Physician: Juel BurrowMASOUD, JAVED Treating Evlyn KannerBritto, Errol Referring Physician: Corky DownsMASOUD, JAVED Physician/Extender: Weeks in Treatment: 26 Active Problems Location of Pain Severity and Description of Pain Patient Has Paino No Site Locations Pain Management and Medication Current Pain Management: Electronic Signature(s) Signed: 08/02/2014 1:58:37 PM By: Elpidio EricAfful, Rita BSN, RN Entered By: Elpidio EricAfful, Rita on 08/02/2014  13:58:37 MELLINA, BENISON ([id number]) -------------------------------------------------------------------------------- Patient/Caregiver Education Details Patient Name: [name] Date of Service: 08/02/2014 1:45 PM Medical Record Patient Account Number: [id number] Number: Afful, RN, BSN, Treating RN: [birth year] (81 y.o. [place] Date of Birth/Gender: Female) Other Clinician: Primary Care Physician: Juel Burrow, JAVED Treating Evlyn Kanner Referring Physician: Corky Downs Physician/Extender: Tania Ade in Treatment: 25 Education Assessment Education Provided To: Caregiver Education Topics Provided Basic Hygiene: Methods: Explain/Verbal Responses: State content correctly Offloading: Methods: Explain/Verbal Responses: State content correctly Electronic Signature(s) Signed: 08/02/2014 2:16:26 PM By: Elpidio Eric BSN, RN Entered By: Elpidio Eric on 08/02/2014 14:16:26 [name] ([id number]) -------------------------------------------------------------------------------- Wound  Assessment Details Patient Name: [name] Date of Service: 08/02/2014 1:45 PM Medical Record Patient Account Number: [id number] Number: Afful, RN, BSN, Treating RN: 13-Jun-1913 (81 y.o. [place] Date of Birth/Sex: Female) Other Clinician: Primary Care Physician: MASOUD, JAVED Treating Britto, Errol Referring Physician: Corky Downs Physician/Extender: Weeks in Treatment: 26 Wound Status Wound Number: 1 Primary Pressure Ulcer Etiology: Wound Location: Left Calcaneous Wound Healed - Epithelialized Wounding Event: Pressure Injury Status: Date Acquired: 12/31/2013 Comorbid History of pressure wounds, Weeks Of Treatment: 26 History: Osteoarthritis, Dementia Clustered Wound: No Photos Photo Uploaded By: Elpidio Eric on 08/02/2014 16:46:01 Wound Measurements Length: (cm) 0 % Reductio Width: (cm) 0 % Reductio Depth: (cm) 0 Epithelial Area: (cm) 0 Tunneling Volume: (cm) 0 Undermini n in Area: 100% n in Volume: 100% ization: Small (1-33%) : No ng: No Wound Description Classification: Category/Stage III Wound Margin: Distinct, outline attached Exudate Amount: None Present Foul Odor After Cleansing: No Wound Bed Granulation Amount: None Present (0%) Exposed Structure Necrotic Amount: Small (1-33%) Fascia Exposed: No Necrotic Quality: Eschar Fat Layer Exposed: No Tendon Exposed: No Muscle Exposed: No Funez, Tanara ([id number]) Joint Exposed: No Bone Exposed: No Limited to Skin Breakdown Periwound Skin Texture Texture Color No Abnormalities Noted: No No Abnormalities Noted: No Callus: Yes Atrophie Blanche: No Crepitus: No Cyanosis: No Excoriation: No Ecchymosis: No Fluctuance: No Erythema: No Friable: No Hemosiderin Staining: No Induration: No Mottled: No Localized Edema: Yes Pallor: No Rash: No Rubor: No Scarring: No Temperature / Pain Moisture Temperature: No Abnormality No Abnormalities Noted: No Dry / Scaly: Yes Maceration: No Moist:  No Wound Preparation Ulcer Cleansing: Rinsed/Irrigated with Saline Topical Anesthetic Applied: Other: Lidocaine 4% Ointment, Electronic Signature(s) Signed: 08/02/2014 4:48:17 PM By: Elpidio Eric BSN, RN Previous Signature: 08/02/2014 2:07:19 PM Version By: Elpidio Eric BSN, RN Entered By: Elpidio Eric on 08/02/2014 14:13:49 BRENDI, MCCARROLL ([id number]) -------------------------------------------------------------------------------- Vitals Details Patient Name: [name] Date of Service: 08/02/2014 1:45 PM Medical Record Patient Account Number: [id number] Number: Afful, RN, BSN, Treating RN: 11-12-1913 (81 y.o.  [place] Date of Birth/Sex: Female) Other Clinician: Primary Care Physician: MASOUD, JAVED Treating Britto, Errol Referring Physician: Corky Downs Physician/Extender: Weeks in Treatment: 26 Vital Signs Time Taken: 14:00 Temperature (F): 97.7 Height (in): 69 Pulse (bpm): 81 Weight (lbs): 189 Respiratory Rate (breaths/min): 18 Body Mass Index (BMI): 27.9 Blood Pressure (mmHg): 81/53 Reference Range: 80 - 120 mg / dl Electronic Signature(s) Signed: 08/02/2014 2:00:57 PM By: Elpidio Eric BSN, RN Entered By: Elpidio Eric on 08/02/2014 14:00:57

## 2014-08-02 NOTE — Progress Notes (Signed)
Tracy Le ([id number]) Visit Report for 08/02/2014 Chief Complaint Document Details Patient Name: Tracy Le Date of Service: 08/02/2014 1:45 PM Medical Record Number: [id number] Patient Account Number: [id number] Date of Birth/Sex: Sep 24, 1913 (81 y.o. 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 She has no fresh complaints. she does not walk around much except transferring from a chair to the bed. Electronic Signature(s) Signed: 08/02/2014 3:45:00 PM By: Tracy KannerBritto, Tracy Bowring Tracy Le Entered By: Tracy KannerBritto, Skarlet Lyons on 08/02/2014 14:15:43 Tracy Le ([id number]) -------------------------------------------------------------------------------- HPI Details Patient Name: Tracy Le Date of Service: 08/02/2014 1:45 PM Medical Record Number: [id number] Patient Account Number: [id number] Date of Birth/Sex: Sep 24, 1913 (81 y.o. Female) Treating Tracy Le: Primary Care Physician: Tracy DownsMASOUD, Tracy Other Clinician: Referring Physician: Corky DownsMASOUD, Tracy Treating Physician/Extender: Tracy Le, Tracy Le Le in Treatment: 26 History of Present Illness Location: left calcaneus Quality: Patient reports No Pain. Severity: Patient states wound (s) are getting better. Duration: Patient states that they are not certain how long the wound has been present. Timing: nil Context: The wound appeared gradually over time Modifying Factors: sitting most of the day Associated Signs and Symptoms: Patient reports having difficulty standing for long periods. HPI Description: The patient is an 81 year old female with a treatment history of a stage III left calcaneal wound. The patient is mostly bedbound at home. She does spend some time sitting up  in a chair. She does not have an air mattress at home. Her caretaker has been using triple antibiotic on the bone itself. She denies any fevers at home. The caretaker denies any history of diabetes or vascular disease. Since being seen here here in the wound clinic she has been applying Santyl to the ulcer base. the patient returns for follow-up today. They have been compliant with dressing changes. 03/29/14 -- the dressings have been done on a daily basis according to plan and the daughter says that she is running out of Santyl ointment and would need a new prescription. 04/26/2014 patient's daughter-in-law is comfortable doing the dressing and after discussing with her it is okay if the home health stops going to do the dressing once a week. 03/29/14 - she seems to be doing fine and has no fresh issues. The daughter has been doing her dressings on a regular basis and has been doing them according to the plan. 05/09/2014 the daughter noticed a small ulcer on the right buttock medially which is tiny and a stage II pressure ulcer. Electronic Signature(s) Signed: 08/02/2014 3:45:00 PM By: Tracy KannerBritto, Tracy Friesenhahn Tracy Le Entered By: Tracy KannerBritto, Sawsan Riggio on 08/02/2014 14:15:48 Tracy Le ([id number]) -------------------------------------------------------------------------------- Physical Exam Details Patient Name: Tracy Le Date of Service: 08/02/2014 1:45 PM Medical Record Number: [id number] Patient Account Number: [id number] Date of Birth/Sex: Sep 24, 1913 (81 y.o. Female) Treating Tracy Le: Primary Care Physician: Tracy DownsMASOUD, Tracy Other Clinician: Referring Physician: Corky DownsMASOUD, Tracy Treating Physician/Extender: Tracy Le, Tracy Le Le in Treatment: 26 Constitutional . Pulse regular. Respirations normal and unlabored. Afebrile. . Eyes Nonicteric. Reactive to light. Ears, Nose, Mouth, and Throat Lips, teeth, and gums WNL.Marland Kitchen. Moist mucosa without lesions . Neck supple and nontender. No palpable supraclavicular or cervical  adenopathy. Normal sized without goiter. Respiratory WNL. No retractions.. Cardiovascular Pedal Pulses WNL. No clubbing, cyanosis or edema. Musculoskeletal Adexa without tenderness or enlargement.. Digits and nails w/o clubbing, cyanosis, infection, petechiae,  ischemia, or inflammatory conditions.. Integumentary (Hair, Skin) No suspicious lesions. the ulcer on the left heel has completely healed.. No crepitus or fluctuance. No peri- wound warmth or erythema. No masses.Marland Kitchen Psychiatric Judgement and insight Intact.. No evidence of depression, anxiety, or agitation.. Electronic Signature(s) Signed: 08/02/2014 3:45:00 PM By: Tracy Le Tracy Le Entered By: Tracy Le on 08/02/2014 14:16:22 Tracy Le ([id number]) -------------------------------------------------------------------------------- Physician Orders Details Patient Name: Tracy Le Date of Service: 08/02/2014 1:45 PM Medical Record Patient Account Number: [id number] Number: Le, Tracy Le, Treating Tracy Le: Oct 26, 1913 (81 y.o. Tracy Le Date of Birth/Sex: Female) Other Clinician: Primary Care Physician: Le, Tracy Treating [name] Referring Physician: Corky Le Physician/Extender: Tracy Le in Treatment: 15 Verbal / Phone Orders: Yes Clinician: Afful, Tracy Le, Tracy Read Back and Verified: Yes Diagnosis Coding Off-Loading o Heel suspension boot to: - sage boots Discharge From [facility] o Discharge from Wound Care Center - Treatment completed Electronic Signature(s) Signed: 08/02/2014 2:15:25 PM By: Tracy Le BSN, Tracy Le Signed: 08/02/2014 3:45:00 PM By: Tracy Le Tracy Le Previous Signature: 08/02/2014 2:13:28 PM Version By: Tracy Le BSN, Tracy Le Entered By: Tracy Le on 08/02/2014 14:15:25 Tracy Le ([id number]) -------------------------------------------------------------------------------- Problem List Details Patient Name: Tracy Le Date of Service: 08/02/2014 1:45 PM Medical Record Number:  [id number] Patient Account Number: [id number] Date of Birth/Sex: [birth year] (81 y.o. Female) Treating Tracy Le: Primary Care Physician: Tracy Le Other Clinician: Referring Physician: Corky Le Treating Physician/Extender: Tracy Re in Treatment: 26 Active Problems ICD-10 Encounter Code Description Active Date Diagnosis L89.623 Pressure ulcer of left heel, stage 3 01/31/2014 Yes L89.312 Pressure ulcer of right buttock, stage 2 05/09/2014 Yes Inactive Problems Resolved Problems Electronic Signature(s) Signed: 08/02/2014 3:45:00 PM By: Tracy Le Tracy Le Entered By: Tracy Le on 08/02/2014 14:15:36 Tracy Le ([id number]) -------------------------------------------------------------------------------- Progress Note Details Patient Name: Tracy Le Date of Service: 08/02/2014 1:45 PM Medical Record Number: [id number] Patient Account Number: [id number] Date of Birth/Sex: 04-09-1913 (81 y.o. 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 She has no fresh complaints. she does not walk around much except transferring from a chair to the bed. History of Present Illness (HPI) The following HPI elements were documented for the patient's wound: Location: left calcaneus Quality: Patient reports No Pain. Severity: Patient states wound (s) are getting better. Duration: Patient states that they are not certain how long the wound has been present. Timing: nil Context: The wound appeared gradually over time Modifying Factors: sitting most of the day Associated Signs and Symptoms: Patient reports having difficulty standing for long periods. The patient is an 81 year old  female with a treatment history of a stage III left calcaneal wound. The patient is mostly bedbound at home. She does spend some time sitting up in a chair. She does not have an air mattress at home. Her caretaker has been using triple antibiotic on the bone itself. She denies any fevers at home. The caretaker denies any history of diabetes or vascular disease. Since being seen here here in the wound clinic she has been applying Santyl to the ulcer base. the patient returns for follow-up today. They have been compliant with dressing changes. 03/29/14 -- the dressings have been done on a daily basis according to plan and the daughter says that she is running  out of Santyl ointment and would need a new prescription. 04/26/2014 patient's daughter-in-law is comfortable doing the dressing and after discussing with her it is okay if the home health stops going to do the dressing once a week. 03/29/14 - she seems to be doing fine and has no fresh issues. The daughter has been doing her dressings on a regular basis and has been doing them according to the plan. 05/09/2014 the daughter noticed a small ulcer on the right buttock medially which is tiny and a stage II pressure ulcer. Tracy Le ([id number]) Objective Constitutional Pulse regular. Respirations normal and unlabored. Afebrile. Vitals Time Taken: 2:00 PM, Height: 69 in, Weight: 189 lbs, BMI: 27.9, Temperature: 97.7 F, Pulse: 81 bpm, Respiratory Rate: 18 breaths/min, Blood Pressure: 81/53 mmHg. Eyes Nonicteric. Reactive to light. Ears, Nose, Mouth, and Throat Lips, teeth, and gums WNL.Marland Kitchen Moist mucosa without lesions . Neck supple and nontender. No palpable supraclavicular or cervical adenopathy. Normal sized without goiter. Respiratory WNL. No retractions.. Cardiovascular Pedal Pulses WNL. No clubbing, cyanosis or edema. Musculoskeletal Adexa without tenderness or enlargement.. Digits and nails w/o clubbing, cyanosis, infection,  petechiae, ischemia, or inflammatory conditions.Marland Kitchen Psychiatric Judgement and insight Intact.. No evidence of depression, anxiety, or agitation.. Integumentary (Hair, Skin) No suspicious lesions. the ulcer on the left heel has completely healed.. No crepitus or fluctuance. No peri- wound warmth or erythema. No masses.. Wound #1 status is Healed - Epithelialized. Original cause of wound was Pressure Injury. The wound is located on the Left Calcaneous. The wound measures 0cm length x 0cm width x 0cm depth; 0cm^2 area and 0cm^3 volume. The wound is limited to skin breakdown. There is no tunneling or undermining noted. There is a none present amount of drainage noted. The wound margin is distinct with the outline attached to the wound base. There is no granulation within the wound bed. There is a small (1-33%) amount of necrotic tissue within the wound bed including Eschar. The periwound skin appearance exhibited: Callus, Localized Edema, Dry/Scaly. The periwound skin appearance did not exhibit: Crepitus, Excoriation, Fluctuance, Friable, Induration, Rash, Scarring, Maceration, Moist, Atrophie Blanche, Cyanosis, Ecchymosis, Hemosiderin Staining, Mottled, Pallor, Rubor, Erythema. Periwound temperature was noted as No Birkel, Tracy Le ([id number]) Abnormality. Assessment Active Problems ICD-10 Y86.578 - Pressure ulcer of left heel, stage 3 L89.312 - Pressure ulcer of right buttock, stage 2 The wound is completely healed and we are discharging her from the wound care services and will see her back as needed. The daughter understands offloading very well and she will do the need full. Plan Off-Loading: Heel suspension boot to: - sage boots Discharge From [facility] Services: Discharge from Wound Care Center - Treatment completed The wound is completely healed and we are discharging her from the wound care services and will see her back as needed. The daughter understands offloading very well and she will do  the need full. Electronic Signature(s) Signed: 08/02/2014 3:45:00 PM By: Tracy Le Tracy Le Entered By: Tracy Le on 08/02/2014 14:17:06 MELI, FALEY ([id number]) -------------------------------------------------------------------------------- SuperBill Details Patient Name: Tracy Le Date of Service: 08/02/2014 Medical Record Number: [id number] Patient Account Number: [id number] Date of Birth/Sex: 05-06-1913 (81 y.o. Female) Treating Tracy Le: Primary Care Physician: Tracy Le Other Clinician: Referring Physician: Corky Le Treating Physician/Extender: Tracy Re in Treatment: 26 Diagnosis Coding ICD-10 Codes Code Description [phone] Pressure ulcer of left heel, stage 3 L89.312 Pressure ulcer of right buttock, stage 2 Physician Procedures CPT4 Code: [id number] Description: 99213 - WC PHYS LEVEL 3 - EST PT ICD-10 Description  Diagnosis L89.623 Pressure ulcer of left heel, stage 3 Modifier: Quantity: 1 Electronic Signature(s) Signed: 08/02/2014 3:45:00 PM By: Tracy Le Tracy Le Entered By: Tracy Le on 08/02/2014 14:17:19

## 2014-09-08 ENCOUNTER — Emergency Department
Admission: EM | Admit: 2014-09-08 | Discharge: 2014-09-08 | Disposition: A | Discharge status: Home / Self Care | Payer: Medicare Other | Attending: Emergency Medicine | Admitting: Emergency Medicine

## 2014-09-08 ENCOUNTER — Other Ambulatory Visit: Payer: Self-pay

## 2014-09-08 DIAGNOSIS — R011 Cardiac murmur, unspecified: Secondary | ICD-10-CM | POA: Insufficient documentation

## 2014-09-08 DIAGNOSIS — H547 Unspecified visual loss: Secondary | ICD-10-CM | POA: Diagnosis not present

## 2014-09-08 DIAGNOSIS — N390 Urinary tract infection, site not specified: Secondary | ICD-10-CM | POA: Diagnosis not present

## 2014-09-08 DIAGNOSIS — E860 Dehydration: Secondary | ICD-10-CM

## 2014-09-08 DIAGNOSIS — R4182 Altered mental status, unspecified: Secondary | ICD-10-CM | POA: Diagnosis present

## 2014-09-08 DIAGNOSIS — F0390 Unspecified dementia without behavioral disturbance: Secondary | ICD-10-CM | POA: Insufficient documentation

## 2014-09-08 DIAGNOSIS — N289 Disorder of kidney and ureter, unspecified: Secondary | ICD-10-CM

## 2014-09-08 DIAGNOSIS — H9190 Unspecified hearing loss, unspecified ear: Secondary | ICD-10-CM | POA: Insufficient documentation

## 2014-09-08 LAB — URINALYSIS COMPLETE WITH MICROSCOPIC (ARMC ONLY)
BILIRUBIN URINE: NEGATIVE
Glucose, UA: NEGATIVE mg/dL
KETONES UR: NEGATIVE mg/dL
Nitrite: NEGATIVE
Protein, ur: NEGATIVE mg/dL
Specific Gravity, Urine: 1.009 (ref 1.005–1.030)
pH: 5 (ref 5.0–8.0)

## 2014-09-08 LAB — CBC WITH DIFFERENTIAL/PLATELET
Basophils Absolute: 0.1 10*3/uL (ref 0–0.1)
Basophils Relative: 1 %
EOS PCT: 4 %
Eosinophils Absolute: 0.3 10*3/uL (ref 0–0.7)
HEMATOCRIT: 33 % — AB (ref 35.0–47.0)
Hemoglobin: 10.6 g/dL — ABNORMAL LOW (ref 12.0–16.0)
Lymphocytes Relative: 30 %
Lymphs Abs: 2.4 10*3/uL (ref 1.0–3.6)
MCH: 30.7 pg (ref 26.0–34.0)
MCHC: 32.2 g/dL (ref 32.0–36.0)
MCV: 95.5 fL (ref 80.0–100.0)
MONO ABS: 0.7 10*3/uL (ref 0.2–0.9)
MONOS PCT: 9 %
Neutro Abs: 4.4 10*3/uL (ref 1.4–6.5)
Neutrophils Relative %: 56 %
Platelets: 141 10*3/uL — ABNORMAL LOW (ref 150–440)
RBC: 3.46 MIL/uL — ABNORMAL LOW (ref 3.80–5.20)
RDW: 13.3 % (ref 11.5–14.5)
WBC: 7.9 10*3/uL (ref 3.6–11.0)

## 2014-09-08 LAB — COMPREHENSIVE METABOLIC PANEL
ALK PHOS: 82 U/L (ref 38–126)
ALT: 8 U/L — ABNORMAL LOW (ref 14–54)
ANION GAP: 8 (ref 5–15)
AST: 20 U/L (ref 15–41)
Albumin: 3.8 g/dL (ref 3.5–5.0)
BILIRUBIN TOTAL: 0.5 mg/dL (ref 0.3–1.2)
BUN: 38 mg/dL — ABNORMAL HIGH (ref 6–20)
CO2: 21 mmol/L — AB (ref 22–32)
Calcium: 9.3 mg/dL (ref 8.9–10.3)
Chloride: 110 mmol/L (ref 101–111)
Creatinine, Ser: 2.55 mg/dL — ABNORMAL HIGH (ref 0.44–1.00)
GFR calc non Af Amer: 15 mL/min — ABNORMAL LOW (ref >60)
GFR, EST AFRICAN AMERICAN: 17 mL/min — AB (ref >60)
Glucose, Bld: 93 mg/dL (ref 65–99)
Potassium: 5 mmol/L (ref 3.5–5.1)
Sodium: 139 mmol/L (ref 135–145)
Total Protein: 7.9 g/dL (ref 6.5–8.1)

## 2014-09-08 MED ORDER — CEFTRIAXONE SODIUM 1 G IJ SOLR
1.0000 g | Freq: Once | INTRAMUSCULAR | Status: AC
  Administered 2014-09-08: 1 g via INTRAVENOUS
  Filled 2014-09-08: qty 10

## 2014-09-08 MED ORDER — SODIUM CHLORIDE 0.9 % IV BOLUS (SEPSIS)
500.0000 mL | Freq: Once | INTRAVENOUS | Status: AC
  Administered 2014-09-08: 500 mL via INTRAVENOUS

## 2014-09-08 MED ORDER — CEPHALEXIN 500 MG PO CAPS: 500.0000 mg | ORAL_CAPSULE | Freq: Four times a day (QID) | ORAL | Status: DC

## 2014-09-08 NOTE — ED Notes (Signed)
Pt held for medication administration.

## 2014-09-08 NOTE — ED Provider Notes (Signed)
Tracy Le Emergency Department Provider Note  ____________________________________________  Time seen: 1330  I have reviewed the triage vital signs and the nursing notes.  Patient with limited hearing and cognition. History is limited by this. The daughter supplies most of the information.   HISTORY  Chief Complaint Altered Mental Status  decreased oral intake.    HPI Tracy Le is a 81 y.o. female  who is brought in to the emergency department by her family due to concerns for dehydration. Contrary to the chief complaint of record, the patient's family denies any change in mentation. She has limited function at baseline, including decreased vision and hardness of hearing. She is communicating in her usual manner. She has been telling her daughter that she feels she has high blood pressure. Due to this self-report of high blood pressure, the patient is refusing to eat certain foods. In addition, the patient is not drinking as much fluid as usual. She had decreased urinary output yesterday. She does wear an undergarment/diaper. While changing that this morning, the daughter noted that she did urinate this morning.  The daughter called the patient's physician, Dr. Harl Le. They referred the patient to the emergency department for further evaluation.  The daughter, who provides the history, denies any fever, cough, or other acute changes.   History reviewed. No pertinent past medical history.  Dementia Limited sight Hard of hearing  There are no active problems to display for this patient.   History reviewed. No pertinent past surgical history.  Current Outpatient Rx  Name  Route  Sig  Dispense  Refill  . cephALEXin (KEFLEX) 500 MG capsule   Oral   Take 1 capsule (500 mg total) by mouth 4 (four) times daily.   28 capsule   0     Allergies Review of patient's allergies indicates no known allergies.  No family history on file.  Social  History Social History  Substance Use Topics  . Smoking status: Never Smoker   . Smokeless tobacco: None  . Alcohol Use: No    Review of Systems Limited review of systems due to the patient's limited communication.  Constitutional: Negative for fever. Cardiovascular: Negative for chest pain. Respiratory: Negative for shortness of breath. Gastrointestinal: Negative for abdominal pain, vomiting and diarrhea. Genitourinary: Negative for dysuria. Musculoskeletal: No myalgias or injuries. Skin: Negative for rash. Neurological: No acute change area history of limited vision and hearing.   10-point ROS otherwise negative.  ____________________________________________   PHYSICAL EXAM:  VITAL SIGNS: ED Triage Vitals  Enc Vitals Group     BP 09/08/14 1259 139/82 mmHg     Pulse Rate 09/08/14 1259 83     Resp 09/08/14 1259 18     Temp 09/08/14 1259 97.8 F (36.6 C)     Temp Source 09/08/14 1259 Oral     SpO2 09/08/14 1259 100 %     Weight 09/08/14 1259 200 lb (90.719 kg)     Height 09/08/14 1259  (1.854 m)     Head Cir --      Peak Flow --      Pain Score 09/08/14 1316 0     Pain Loc --      Pain Edu? --      Excl. in GC? --     Constitutional: Alert, but not oriented. She answers simple questions in a simple manner but is unable to answer more complex questions. ENT   Head: Normocephalic and atraumatic.   Nose: No congestion/rhinnorhea.  Mouth/Throat: Mucous membranes are moist. Cardiovascular: Normal rate, regular rhythm. She has an impressive murmur, both systolic ejection as well as a squeak. The squeak is variable. Respiratory:  Normal respiratory effort, no tachypnea.    Breath sounds are clear and equal bilaterally.  Gastrointestinal: Soft and nontender. No distention.  Back: No muscle spasm, no tenderness, no CVA tenderness. Musculoskeletal: No deformity noted. Nontender with normal range of motion in all extremities.  Noted edema in both legs,  appears chronic. Neurologic: Limited cognition and speech. At baseline according to her daughter. Moves all 4 extremities. Skin:  Skin is warm, dry. No rash noted. Psychiatric: Smiles and interacts to some degree. Unable to answer certain questions. Appears somewhat confused. ____________________________________________    LABS (pertinent positives/negatives)  Labs Reviewed  CBC WITH DIFFERENTIAL/PLATELET - Abnormal; Notable for the following:    RBC 3.46 (*)    Hemoglobin 10.6 (*)    HCT 33.0 (*)    Platelets 141 (*)    All other components within normal limits  COMPREHENSIVE METABOLIC PANEL - Abnormal; Notable for the following:    CO2 21 (*)    BUN 38 (*)    Creatinine, Ser 2.55 (*)    ALT 8 (*)    GFR calc non Af Amer 15 (*)    GFR calc Af Amer 17 (*)    All other components within normal limits  URINALYSIS COMPLETEWITH MICROSCOPIC ([facility] ONLY) - Abnormal; Notable for the following:    Color, Urine YELLOW (*)    APPearance CLOUDY (*)    Hgb urine dipstick 1+ (*)    Leukocytes, UA 3+ (*)    Bacteria, UA MANY (*)    Squamous Epithelial / LPF 0-5 (*)    All other components within normal limits     ____________________________________________   EKG  ED ECG REPORT I, Elizar Alpern W, the attending physician, personally viewed and interpreted this ECG.   Date: 09/08/2014  EKG Time: 13 11 PM  Rate: 76  Rhythm: Normal sinus rhythm  Axis: Left axis deviation  Intervals: Normal  ST&T Change: None noted   ____________________________________________    INITIAL IMPRESSION / ASSESSMENT AND PLAN / ED COURSE  Pertinent labs & imaging results that were available during my care of the patient were reviewed by me and considered in my medical decision making (see chart for details).  Pleasant 81 year old female, baseline confusion, presents with concerns for possible dehydration. Labs pending, including UA. No indication for CT of  head.  ----------------------------------------- 3:12 PM on 09/08/2014 -----------------------------------------  The patient has a urinary tract infection with white blood cell count too numerous to count, the sites 3+, and bacturia. Her serum white blood cell count is normal. She does have renal dysfunction with a BUN of 38 and creatinine of 2.5.   I do not have comparison labs for this patient. I do not know what her baseline renal function is. Due to this and the notable urinary tract infection, I offered admission to the hospital. The family prefers to bring the patient home. I think this is reasonable. I will treat her with a dose of ceftriaxone here, an additional 500 ML's of normal saline, and the family will follow-up with the patient's doctor, Dr. Harl Le.  ____________________________________________   FINAL CLINICAL IMPRESSION(S) / ED DIAGNOSES  Final diagnoses:  Renal insufficiency  UTI (lower urinary tract infection)  Dehydration      Darien Ramus, MD 09/08/14 [phone]

## 2014-09-08 NOTE — ED Notes (Addendum)
Per pt daughter n Nurse, mental health.the patient has not been acting herself since Monday, not eating well, did not urinate any yesterday but did when she changed her this morning. C/o eyes/vision changes..thinks her b/p is elevated

## 2014-09-08 NOTE — ED Notes (Signed)
Helped nurse nellie and nurse Tobi Bastos place patient on bed pan.

## 2014-09-08 NOTE — Discharge Instructions (Signed)
You have a urinary tract infection. He will also have some renal dysfunction. This could be normal for you at this point or a could be a sign of dehydration. You were treated with antibiotics and additional IV fluid. We spoke about staying in the hospital, but agreed that discharge home was reasonable and will follow-up with your regular doctor. Return to the emergency department if there is further weakness, if there is further apparent confusion, or if you have other urgent concerns.  Dehydration Dehydration is when you lose more fluids from the body than you take in. Vital organs such as the kidneys, brain, and heart cannot function without a proper amount of fluids and salt. Any loss of fluids from the body can cause dehydration.  Older adults are at a higher risk of dehydration than younger adults. As we age, our bodies are less able to conserve water and do not respond to temperature changes as well. Also, older adults do not become thirsty as easily or quickly. Because of this, older adults often do not realize they need to increase fluids to avoid dehydration.  CAUSES   Vomiting.  Diarrhea.  Excessive sweating.  Excessive urination.  Fever.  Certain medicines, such as blood pressure medicines called diuretics.  Poorly controlled blood sugars. SIGNS AND SYMPTOMS  Mild dehydration:  Thirst.  Dry lips.  Slightly dry mouth. Moderate dehydration:  Very dry mouth.  Sunken eyes.  Skin does not bounce back quickly when lightly pinched and released.  Dark urine and decreased urine production.  Decreased tear production.  Headache. Severe dehydration:  Very dry mouth.  Extreme thirst.  Rapid, weak pulse (more than 100 beats per minute at rest).  Cold hands and feet.  Not able to sweat in spite of heat.  Rapid breathing.  Blue lips.  Confusion and lethargy.  Difficulty being awakened.  Minimal urine production.  No tears. DIAGNOSIS  Your health care  provider will diagnose dehydration based on your symptoms and your exam. Blood and urine tests will help confirm the diagnosis. The diagnostic evaluation should also identify the cause of dehydration. TREATMENT  Treatment of mild or moderate dehydration can often be done at home by increasing the amount of fluids that you drink. It is best to drink small amounts of fluid more often. Drinking too much at one time can make vomiting worse. Severe dehydration needs to be treated at the hospital. You may be given IV fluids that contain water and electrolytes. HOME CARE INSTRUCTIONS   Ask your health care provider about specific rehydration instructions.  Drink enough fluids to keep your urine clear or pale yellow.  Drink small amounts frequently if you have nausea and vomiting.  Eat as you normally do.  Avoid:  Foods or drinks high in sugar.  Carbonated drinks.  Juice.  Extremely hot or cold fluids.  Drinks with caffeine.  Fatty, greasy foods.  Alcohol.  Tobacco.  Overeating.  Gelatin desserts.  Wash your hands well to avoid spreading bacteria and viruses.  Only take over-the-counter or prescription medicines for pain, discomfort, or fever as directed by your health care provider.  Ask your health care provider if you should continue all prescribed and over-the-counter medicines.  Keep all follow-up appointments with your health care provider. SEEK MEDICAL CARE IF:  You have abdominal pain, and it increases or stays in one area (localizes).  You have a rash, stiff neck, or severe headache.  You are irritable, sleepy, or difficult to awaken.  You are weak,  dizzy, or extremely thirsty.  You have a fever. SEEK IMMEDIATE MEDICAL CARE IF:   You are unable to keep fluids down, or you get worse despite treatment.  You have frequent episodes of vomiting or diarrhea.  You have blood or green matter (bile) in your vomit.  You have blood in your stool, or your stool looks  black and tarry.  You have not urinated in 6-8 hours, or you have only urinated a small amount of very dark urine.  You faint. MAKE SURE YOU:   Understand these instructions.  Will watch your condition.  Will get help right away if you are not doing well or get worse. Document Released: 04/06/2003 Document Revised: 01/19/2013 Document Reviewed: 09/21/2012 [facility] Patient Information 2015 [place], [place]. This information is not intended to replace advice given to you by your health care provider. Make sure you discuss any questions you have with your health care provider.

## 2014-11-14 ENCOUNTER — Emergency Department
Admission: EM | Admit: 2014-11-14 | Discharge: 2014-11-14 | Disposition: A | Discharge status: Home / Self Care | Payer: Medicare Other | Attending: Emergency Medicine | Admitting: Emergency Medicine

## 2014-11-14 ENCOUNTER — Encounter: Payer: Self-pay | Admitting: Emergency Medicine

## 2014-11-14 DIAGNOSIS — Y92092 Bedroom in other non-institutional residence as the place of occurrence of the external cause: Secondary | ICD-10-CM | POA: Diagnosis not present

## 2014-11-14 DIAGNOSIS — Y9389 Activity, other specified: Secondary | ICD-10-CM | POA: Diagnosis not present

## 2014-11-14 DIAGNOSIS — W01198A Fall on same level from slipping, tripping and stumbling with subsequent striking against other object, initial encounter: Secondary | ICD-10-CM | POA: Diagnosis not present

## 2014-11-14 DIAGNOSIS — Y998 Other external cause status: Secondary | ICD-10-CM | POA: Diagnosis not present

## 2014-11-14 DIAGNOSIS — S0181XA Laceration without foreign body of other part of head, initial encounter: Secondary | ICD-10-CM | POA: Insufficient documentation

## 2014-11-14 NOTE — Discharge Instructions (Signed)
Facial Laceration ° A facial laceration is a cut on the face. These injuries can be painful and cause bleeding. Lacerations usually heal quickly, but they need special care to reduce scarring. °DIAGNOSIS  °Your health care provider will take a medical history, ask for details about how the injury occurred, and examine the wound to determine how deep the cut is. °TREATMENT  °Some facial lacerations may not require closure. Others may not be able to be closed because of an increased risk of infection. The risk of infection and the chance for successful closure will depend on various factors, including the amount of time since the injury occurred. °The wound may be cleaned to help prevent infection. If closure is appropriate, pain medicines may be given if needed. Your health care provider will use stitches (sutures), wound glue (adhesive), or skin adhesive strips to repair the laceration. These tools bring the skin edges together to allow for faster healing and a better cosmetic outcome. If needed, you may also be given a tetanus shot. °HOME CARE INSTRUCTIONS °· Only take over-the-counter or prescription medicines as directed by your health care provider. °· Follow your health care provider's instructions for wound care. These instructions will vary depending on the technique used for closing the wound. °For Sutures: °· Keep the wound clean and dry.   °· If you were given a bandage (dressing), you should change it at least once a day. Also change the dressing if it becomes wet or dirty, or as directed by your health care provider.   °· Wash the wound with soap and water 2 times a day. Rinse the wound off with water to remove all soap. Pat the wound dry with a clean towel.   °· After cleaning, apply a thin layer of the antibiotic ointment recommended by your health care provider. This will help prevent infection and keep the dressing from sticking.   °· You may shower as usual after the first 24 hours. Do not soak the  wound in water until the sutures are removed.   °· Get your sutures removed as directed by your health care provider. With facial lacerations, sutures should usually be taken out after 4-5 days to avoid stitch marks.   °· Wait a few days after your sutures are removed before applying any makeup. °For Skin Adhesive Strips: °· Keep the wound clean and dry.   °· Do not get the skin adhesive strips wet. You may bathe carefully, using caution to keep the wound dry.   °· If the wound gets wet, pat it dry with a clean towel.   °· Skin adhesive strips will fall off on their own. You may trim the strips as the wound heals. Do not remove skin adhesive strips that are still stuck to the wound. They will fall off in time.   °For Wound Adhesive: °· You may briefly wet your wound in the shower or bath. Do not soak or scrub the wound. Do not swim. Avoid periods of heavy sweating until the skin adhesive has fallen off on its own. After showering or bathing, gently pat the wound dry with a clean towel.   °· Do not apply liquid medicine, cream medicine, ointment medicine, or makeup to your wound while the skin adhesive is in place. This may loosen the film before your wound is healed.   °· If a dressing is placed over the wound, be careful not to apply tape directly over the skin adhesive. This may cause the adhesive to be pulled off before the wound is healed.   °· Avoid   prolonged exposure to sunlight or tanning lamps while the skin adhesive is in place.  The skin adhesive will usually remain in place for 5-10 days, then naturally fall off the skin. Do not pick at the adhesive film.  After Healing: Once the wound has healed, cover the wound with sunscreen during the day for 1 full year. This can help minimize scarring. Exposure to ultraviolet light in the first year will darken the scar. It can take 1-2 years for the scar to lose its redness and to heal completely.  SEEK MEDICAL CARE IF:  You have a fever. SEEK IMMEDIATE  MEDICAL CARE IF:  You have redness, pain, or swelling around the wound.   You see ayellowish-white fluid (pus) coming from the wound.    This information is not intended to replace advice given to you by your health care provider. Make sure you discuss any questions you have with your health care provider.   Document Released: 02/22/2004 Document Revised: 02/04/2014 Document Reviewed: 08/27/2012 Elsevier Interactive Patient Education 2016 [employer]  Nonsutured Laceration Care A laceration is a cut that goes through all layers of the skin and extends into the tissue that is right under the skin. This type of cut is usually stitched up (sutured) or closed with tape (adhesive strips) or skin glue shortly after the injury happens. However, if the wound is dirty or if several hours pass before medical treatment is provided, it is likely that germs (bacteria) will enter the wound. Closing a laceration after bacteria have entered it increases the risk of infection. In these cases, your health care provider may leave the laceration open (nonsutured) and cover it with a bandage. This type of treatment helps prevent infection and allows the wound to heal from the deepest layer of tissue damage up to the surface. An open fracture is a type of injury that may involve nonsutured lacerations. An open fracture is a break in a bone that happens along with one or more lacerations through the skin that is near the fracture site. HOW TO CARE FOR YOUR NONSUTURED LACERATION  Take or apply over-the-counter and prescription medicines only as told by your health care provider.  If you were prescribed an antibiotic medicine, take or apply it as told by your health care provider. Do not stop using the antibiotic even if your condition improves.  Clean the wound one time each day or as told by your health care provider.  Wash the wound with mild soap and water.  Rinse the wound with water to remove all  soap.  Pat your wound dry with a clean towel. Do not rub the wound.  Do not inject anything into the wound unless your health care provider told you to.  Change any bandages (dressings) as told by your health care provider. This includes changing the dressing if it gets wet, dirty, or starts to smell bad.  Keep the dressing dry until your health care provider says it can be removed. Do not take baths, swim, or do anything that puts your wound underwater until your health care provider approves.  Raise (elevate) the injured area above the level of your heart while you are sitting or lying down, if possible.  Do not scratch or pick at the wound.  Check your wound every day for signs of infection. Watch for:  Redness, swelling, or pain.  Fluid, blood, or pus.  Keep all follow-up visits as told by your health care provider. This is important. SEEK MEDICAL  CARE IF: °· You received a tetanus and shot and you have swelling, severe pain, redness, or bleeding at the injection site.   °· You have a fever. °· Your pain is not controlled with medicine. °· You have increased redness, swelling, or pain at the site of your wound. °· You have fluid, blood, or pus coming from your wound. °· You notice a bad smell coming from your wound or your dressing. °· You notice something coming out of the wound, such as wood or glass. °· You notice a change in the color of your skin near your wound. °· You develop a new rash. °· You need to change the dressing frequently due to fluid, blood, or pus draining from the wound. °· You develop numbness around your wound. °SEEK IMMEDIATE MEDICAL CARE IF: °· Your pain suddenly increases and is severe. °· You develop severe swelling around the wound. °· The wound is on your hand or foot and you cannot properly move a finger or toe. °· The wound is on your hand or foot and you notice that your fingers or toes look pale or bluish. °· You have a red streak going away from your  wound. °  °This information is not intended to replace advice given to you by your health care provider. Make sure you discuss any questions you have with your health care provider. °  °Document Released: 12/12/2005 Document Revised: 05/31/2014 Document Reviewed: 01/10/2014 °Elsevier Interactive Patient Education ©2016 [employer] ° °

## 2014-11-14 NOTE — ED Provider Notes (Signed)
[facility] Emergency Department Provider Note  ____________________________________________  Time seen: Approximately 6:26 PM  I have reviewed the triage vital signs and the nursing notes.   HISTORY  Chief Complaint Fall    HPI Tracy Le is a 81 y.o. female since the emergency room status post a fall this morning. She was standing with her walker when she tripped and fell catching the corner of a in table. She sustained a small laceration to her chin which was cared for at home. The bleeding had stopped and then restarted this afternoon. Since family was concerned and brought her into the emergency department for further continued bleeding. It was stopped on their arrival. Per the family the patient has been acting "normal." The patient denies any pain or symptoms.   History reviewed. No pertinent past medical history.  There are no active problems to display for this patient.   Past Surgical History  Procedure Laterality Date  . Cholecystectomy      Current Outpatient Rx  Name  Route  Sig  Dispense  Refill  . cephALEXin (KEFLEX) 500 MG capsule   Oral   Take 1 capsule (500 mg total) by mouth 4 (four) times daily.   28 capsule   0     Allergies Review of patient's allergies indicates no known allergies.  No family history on file.  Social History Social History  Substance Use Topics  . Smoking status: Never Smoker   . Smokeless tobacco: None  . Alcohol Use: No    Review of Systems Constitutional: No fever/chills Eyes: No visual changes. ENT: No sore throat. Cardiovascular: Denies chest pain. Respiratory: Denies shortness of breath. Gastrointestinal: No abdominal pain.  No nausea, no vomiting.  No diarrhea.  No constipation. Genitourinary: Negative for dysuria. Musculoskeletal: Negative for back pain. Skin: Negative for rash. Laceration to chin Neurological: Negative for headaches, focal weakness or numbness.  10-point ROS  otherwise negative.  ____________________________________________   PHYSICAL EXAM:  VITAL SIGNS: ED Triage Vitals  Enc Vitals Group     BP 11/14/14 1655 132/80 mmHg     Pulse Rate 11/14/14 1655 80     Resp 11/14/14 1655 16     Temp 11/14/14 1655 98.5 F (36.9 C)     Temp Source 11/14/14 1655 Oral     SpO2 11/14/14 1655 99 %     Weight 11/14/14 1655 200 lb (90.719 kg)     Height 11/14/14 1655 5\' 11"  (1.803 m)     Head Cir --      Peak Flow --      Pain Score 11/14/14 1657 0     Pain Loc --      Pain Edu? --      Excl. in GC? --     Constitutional: Alert and oriented. Well appearing and in no acute distress. Eyes: Conjunctivae are normal. PERRL. EOMI. Head: Atraumatic. Nose: No congestion/rhinnorhea. Mouth/Throat: Mucous membranes are moist.  Oropharynx non-erythematous. Neck: No stridor.   Cardiovascular: Normal rate, regular rhythm. Grossly normal heart sounds.  Good peripheral circulation. Respiratory: Normal respiratory effort.  No retractions. Lungs CTAB. Gastrointestinal: Soft and nontender. No distention. No abdominal bruits. No CVA tenderness. Musculoskeletal: No lower extremity tenderness nor edema.  No joint effusions. Neurologic:  Normal speech and language. No gross focal neurologic deficits are appreciated. No gait instability. Skin:  Skin is warm, dry and intact. No rash noted. Small syrup 0.5 cm laceration noted to left chin. Bleeding was controlled. No visible foreign  body. Edges were approximated. Psychiatric: Mood and affect are normal. Speech and behavior are normal.  ____________________________________________   LABS (all labs ordered are listed, but only abnormal results are displayed)  Labs Reviewed - No data to display ____________________________________________  EKG   ____________________________________________  RADIOLOGY   ____________________________________________   PROCEDURES  Procedure(s) performed: yes, laceration repair,  see procedure note(s).   LACERATION REPAIR Performed by: Racheal Patches Authorized by: Delorise Royals Cuthriell Consent: Verbal consent obtained. Risks and benefits: risks, benefits and alternatives were discussed Consent given by: patient Patient identity confirmed: provided demographic data   Laceration Location: Left chin  Laceration Length: 0.5 cm  No Foreign Bodies seen or palpated  Amount of cleaning: standard  Skin closure: Skin adhesive    Patient tolerance: Patient tolerated the procedure well with no immediate complications.   Critical Care performed: No  ____________________________________________   INITIAL IMPRESSION / ASSESSMENT AND PLAN / ED COURSE  Pertinent labs & imaging results that were available during my care of the patient were reviewed by me and considered in my medical decision making (see chart for details).  The patient is a 81 year old female who presents emergency department status post fall this morning. Patient's only symptoms and complaints with a laceration to left chin. The patient was brought to the emergency department after area started bleeding again this afternoon. The wound was visualized with no visible foreign bodies. Were approximated. Bleeding was controlled and area was scabbed over. I determine that the area would be appropriate for skin adhesive and skin adhesive was applied with good effect. No bleeding. Patient is nontender to skeletal components underneath laceration. No imaging was obtained. Patient's family was given strict ED precautions should symptoms increase, or she develop any other new symptoms. Patient's family verbalizes understanding. ____________________________________________   FINAL CLINICAL IMPRESSION(S) / ED DIAGNOSES  Final diagnoses:  Chin laceration, initial encounter      Racheal Patches, PA-C 11/14/14 1904  Myrna Blazer, MD 11/14/14 2157

## 2014-11-14 NOTE — ED Notes (Signed)
Fall this morning when getting out of bed.  Patient was standing with walker and fell, hitting chin of corner of end table.  Small laceration to chin.  Bleeding controlled.

## 2015-02-01 ENCOUNTER — Inpatient Hospital Stay
Admission: EM | Admit: 2015-02-01 | Discharge: 2015-02-07 | DRG: 689 | Disposition: A | Discharge status: Skilled Nursing Facility | Payer: Medicare Other | Attending: Internal Medicine | Admitting: Internal Medicine

## 2015-02-01 ENCOUNTER — Encounter: Payer: Self-pay | Admitting: Emergency Medicine

## 2015-02-01 ENCOUNTER — Emergency Department: Payer: Medicare Other

## 2015-02-01 DIAGNOSIS — L8990 Pressure ulcer of unspecified site, unspecified stage: Secondary | ICD-10-CM | POA: Insufficient documentation

## 2015-02-01 DIAGNOSIS — G9340 Encephalopathy, unspecified: Secondary | ICD-10-CM | POA: Diagnosis present

## 2015-02-01 DIAGNOSIS — R531 Weakness: Secondary | ICD-10-CM | POA: Diagnosis present

## 2015-02-01 DIAGNOSIS — B961 Klebsiella pneumoniae [K. pneumoniae] as the cause of diseases classified elsewhere: Secondary | ICD-10-CM | POA: Diagnosis present

## 2015-02-01 DIAGNOSIS — E875 Hyperkalemia: Secondary | ICD-10-CM | POA: Diagnosis present

## 2015-02-01 DIAGNOSIS — B9629 Other Escherichia coli [E. coli] as the cause of diseases classified elsewhere: Secondary | ICD-10-CM | POA: Diagnosis present

## 2015-02-01 DIAGNOSIS — N3001 Acute cystitis with hematuria: Secondary | ICD-10-CM | POA: Diagnosis not present

## 2015-02-01 DIAGNOSIS — R4182 Altered mental status, unspecified: Secondary | ICD-10-CM

## 2015-02-01 DIAGNOSIS — Z9049 Acquired absence of other specified parts of digestive tract: Secondary | ICD-10-CM

## 2015-02-01 DIAGNOSIS — N184 Chronic kidney disease, stage 4 (severe): Secondary | ICD-10-CM | POA: Diagnosis present

## 2015-02-01 DIAGNOSIS — I248 Other forms of acute ischemic heart disease: Secondary | ICD-10-CM | POA: Diagnosis present

## 2015-02-01 DIAGNOSIS — R7989 Other specified abnormal findings of blood chemistry: Secondary | ICD-10-CM

## 2015-02-01 DIAGNOSIS — R778 Other specified abnormalities of plasma proteins: Secondary | ICD-10-CM

## 2015-02-01 DIAGNOSIS — Z66 Do not resuscitate: Secondary | ICD-10-CM | POA: Diagnosis present

## 2015-02-01 DIAGNOSIS — N390 Urinary tract infection, site not specified: Secondary | ICD-10-CM | POA: Diagnosis not present

## 2015-02-01 DIAGNOSIS — F0390 Unspecified dementia without behavioral disturbance: Secondary | ICD-10-CM | POA: Diagnosis present

## 2015-02-01 HISTORY — DX: Chronic kidney disease, unspecified: N18.9

## 2015-02-01 LAB — URINALYSIS COMPLETE WITH MICROSCOPIC (ARMC ONLY)
BILIRUBIN URINE: NEGATIVE
GLUCOSE, UA: NEGATIVE mg/dL
KETONES UR: NEGATIVE mg/dL
NITRITE: NEGATIVE
Protein, ur: 30 mg/dL — AB
Specific Gravity, Urine: 1.012 (ref 1.005–1.030)
pH: 5 (ref 5.0–8.0)

## 2015-02-01 LAB — COMPREHENSIVE METABOLIC PANEL
ALK PHOS: 77 U/L (ref 38–126)
ALT: 16 U/L (ref 14–54)
ANION GAP: 6 (ref 5–15)
AST: 27 U/L (ref 15–41)
Albumin: 4 g/dL (ref 3.5–5.0)
BILIRUBIN TOTAL: 0.4 mg/dL (ref 0.3–1.2)
BUN: 40 mg/dL — ABNORMAL HIGH (ref 6–20)
CALCIUM: 9 mg/dL (ref 8.9–10.3)
CO2: 19 mmol/L — ABNORMAL LOW (ref 22–32)
Chloride: 114 mmol/L — ABNORMAL HIGH (ref 101–111)
Creatinine, Ser: 2.43 mg/dL — ABNORMAL HIGH (ref 0.44–1.00)
GFR, EST AFRICAN AMERICAN: 18 mL/min — AB (ref >60)
GFR, EST NON AFRICAN AMERICAN: 15 mL/min — AB (ref >60)
Glucose, Bld: 95 mg/dL (ref 65–99)
Potassium: 5.4 mmol/L — ABNORMAL HIGH (ref 3.5–5.1)
Sodium: 139 mmol/L (ref 135–145)
TOTAL PROTEIN: 7.9 g/dL (ref 6.5–8.1)

## 2015-02-01 LAB — CBC
HCT: 35.8 % (ref 35.0–47.0)
HEMOGLOBIN: 11.6 g/dL — AB (ref 12.0–16.0)
MCH: 30.8 pg (ref 26.0–34.0)
MCHC: 32.5 g/dL (ref 32.0–36.0)
MCV: 94.7 fL (ref 80.0–100.0)
PLATELETS: 158 10*3/uL (ref 150–440)
RBC: 3.78 MIL/uL — ABNORMAL LOW (ref 3.80–5.20)
RDW: 13.5 % (ref 11.5–14.5)
WBC: 10.3 10*3/uL (ref 3.6–11.0)

## 2015-02-01 LAB — TROPONIN I: Troponin I: 0.16 ng/mL — ABNORMAL HIGH (ref <0.031)

## 2015-02-01 MED ORDER — ENOXAPARIN SODIUM 30 MG/0.3ML ~~LOC~~ SOLN
30.0000 mg | Freq: Every day | SUBCUTANEOUS | Status: DC
  Administered 2015-02-02 (×2): 30 mg via SUBCUTANEOUS
  Filled 2015-02-01 (×2): qty 0.3

## 2015-02-01 MED ORDER — ASPIRIN EC 81 MG PO TBEC
81.0000 mg | DELAYED_RELEASE_TABLET | Freq: Every day | ORAL | Status: DC
  Administered 2015-02-02 – 2015-02-07 (×6): 81 mg via ORAL
  Filled 2015-02-01 (×6): qty 1

## 2015-02-01 MED ORDER — DEXTROSE 5 % IV SOLN
1.0000 g | INTRAVENOUS | Status: DC
  Administered 2015-02-02 – 2015-02-06 (×5): 1 g via INTRAVENOUS
  Filled 2015-02-01 (×7): qty 10

## 2015-02-01 MED ORDER — ACETAMINOPHEN 650 MG RE SUPP: 650.0000 mg | Freq: Four times a day (QID) | RECTAL | Status: DC | PRN

## 2015-02-01 MED ORDER — ACETAMINOPHEN 325 MG PO TABS: 650.0000 mg | ORAL_TABLET | Freq: Four times a day (QID) | ORAL | Status: DC | PRN

## 2015-02-01 MED ORDER — SODIUM CHLORIDE 0.9 % IJ SOLN
3.0000 mL | Freq: Two times a day (BID) | INTRAMUSCULAR | Status: DC
  Administered 2015-02-02 – 2015-02-07 (×12): 3 mL via INTRAVENOUS

## 2015-02-01 MED ORDER — ASPIRIN EC 325 MG PO TBEC
325.0000 mg | DELAYED_RELEASE_TABLET | Freq: Once | ORAL | Status: AC
  Administered 2015-02-01: 325 mg via ORAL
  Filled 2015-02-01: qty 1

## 2015-02-01 MED ORDER — DIPHENHYDRAMINE HCL 25 MG PO CAPS
25.0000 mg | ORAL_CAPSULE | Freq: Every day | ORAL | Status: DC
  Administered 2015-02-02 – 2015-02-06 (×3): 25 mg via ORAL
  Filled 2015-02-01 (×6): qty 1

## 2015-02-01 MED ORDER — SODIUM CHLORIDE 0.9 % IV BOLUS (SEPSIS)
250.0000 mL | Freq: Once | INTRAVENOUS | Status: AC
  Administered 2015-02-02: 01:00:00 250 mL via INTRAVENOUS

## 2015-02-01 MED ORDER — DEXTROSE 5 % IV SOLN
1.0000 g | Freq: Once | INTRAVENOUS | Status: AC
  Administered 2015-02-01: 1 g via INTRAVENOUS
  Filled 2015-02-01: qty 10

## 2015-02-01 NOTE — ED Notes (Signed)
Pt with CT. 

## 2015-02-01 NOTE — ED Notes (Signed)
Showed EKG to Dr Inocencio HomesGayle, requesting repeat at this time due to artifact. Unable to get repeat on patient due to triage and pt's complaint. MD and 1st nurse notified.

## 2015-02-01 NOTE — ED Notes (Signed)
Family reports pt with weakness and confusion x2 days; pt denies any pain or weakness at this time. Pt is very hard of hearing.

## 2015-02-01 NOTE — H&P (Signed)
[facility] [place] at [facility]   PATIENT NAME: Tracy OsierLena Le    MR#:  [id number]  DATE OF BIRTH:  1913/09/17  DATE OF ADMISSION:  02/01/2015  PRIMARY CARE PHYSICIAN: Corky DownsMASOUD, JAVED, MD   REQUESTING/REFERRING PHYSICIAN: Virgilio Freesaroline Norman  CHIEF COMPLAINT:   Chief Complaint  Patient presents with  . Weakness  . Altered Mental Status    HISTORY OF PRESENT ILLNESS:  Tracy OsierLena Le  is a 82 y.o. female brought in by family by being incoherent and aggressive. She is not as mobile as she usually is. She's been talking random things and mumbling. These symptoms have been going on for a few days. The patient is unable to give any history at this time.  PAST MEDICAL HISTORY:   Past Medical History  Diagnosis Date  . Chronic kidney disease     PAST SURGICAL HISTORY:   Past Surgical History  Procedure Laterality Date  . Cholecystectomy      SOCIAL HISTORY:   Social History  Substance Use Topics  . Smoking status: Never Smoker   . Smokeless tobacco: Not on file  . Alcohol Use: No    FAMILY HISTORY:   Family History  Problem Relation Age of Onset  . Family history unknown: Yes    DRUG ALLERGIES:  No Known Allergies  REVIEW OF SYSTEMS:  CONSTITUTIONAL: No fever, positive for weakness.  EYES: No blurred or double vision.  EARS, NOSE, AND THROAT: No tinnitus or ear pain. No sore throat. Decreased hearing RESPIRATORY: No cough, shortness of breath, wheezing or hemoptysis.  CARDIOVASCULAR: No chest pain, orthopnea, edema.  GASTROINTESTINAL: No nausea, vomiting, diarrhea or abdominal pain. No blood in bowel movements GENITOURINARY: No dysuria, hematuria.  ENDOCRINE: No polyuria, nocturia, history of thyroid issues HEMATOLOGY: No anemia, easy bruising or bleeding SKIN: No rash or lesion. MUSCULOSKELETAL: Positive for foot pain   NEUROLOGIC: No tingling, numbness, weakness.  PSYCHIATRY: No anxiety or depression.   MEDICATIONS AT HOME:   Prior to  Admission medications   Not on File    Patient takes no medications  VITAL SIGNS:  Blood pressure 123/66, pulse 78, temperature 98.2 F (36.8 C), temperature source Oral, resp. rate 20, SpO2 100 %.  PHYSICAL EXAMINATION:  GENERAL:  82 y.o.-year-old patient lying in the bed with no acute distress.  EYES: Pupils equal, round, reactive to light and accommodation. No scleral icterus. Extraocular muscles intact.  HEENT: Head atraumatic, normocephalic. Oropharynx and nasopharynx clear.  NECK:  Supple, no jugular venous distention. No thyroid enlargement, no tenderness.  LUNGS: Normal breath sounds bilaterally, no wheezing, rales,rhonchi or crepitation. No use of accessory muscles of respiration.  CARDIOVASCULAR: S1, S2 normal. No murmurs, rubs, or gallops.  ABDOMEN: Soft, nontender, nondistended. Bowel sounds present. No organomegaly or mass.  EXTREMITIES: 2+ edema, no cyanosis, or clubbing.  NEUROLOGIC: Patient able to move extremities on her own Sensation intact. Gait not checked.  PSYCHIATRIC: The patient is alert.  SKIN: No rash, lesion, or ulcer seen anteriorly  LABORATORY PANEL:   CBC  Recent Labs Lab 02/01/15 1647  WBC 10.3  HGB 11.6*  HCT 35.8  PLT 158   ------------------------------------------------------------------------------------------------------------------  Chemistries   Recent Labs Lab 02/01/15 1647  NA 139  K 5.4*  CL 114*  CO2 19*  GLUCOSE 95  BUN 40*  CREATININE 2.43*  CALCIUM 9.0  AST 27  ALT 16  ALKPHOS 77  BILITOT 0.4   ------------------------------------------------------------------------------------------------------------------  Cardiac Enzymes  Recent Labs Lab 02/01/15 1647  TROPONINI 0.16*   ------------------------------------------------------------------------------------------------------------------  RADIOLOGY:  Ct Head Wo Contrast  02/01/2015  CLINICAL DATA:  Weakness and confusion for 2 days EXAM: CT HEAD WITHOUT  CONTRAST TECHNIQUE: Contiguous axial images were obtained from the base of the skull through the vertex without intravenous contrast. COMPARISON:  None. FINDINGS: Bony calvarium is intact. There are chronic atrophic changes as well as changes of white matter ischemia and prior right occipital infarct. No findings to suggest acute hemorrhage, acute infarction or space-occupying mass lesion are noted. IMPRESSION: Chronic atrophic changes as well as chronic ischemic change. Electronically Signed   By: Alcide Clever M.D.   On: 02/01/2015 20:27    EKG:   Normal sinus rhythm 81 bpm, left anterior fascicular block, LVH, Q waves septally  IMPRESSION AND PLAN:   1. Acute encephalopathy, acute cystitis with hematuria. Ordered urine culture. Start Rocephin. 2. Weakness get physical therapy evaluation 3. Elevated troponin without chest pain or shortness of breath likely infection related and demand ischemia. Serial troponins. Check echo. 4. Chronic kidney disease stage IV 5. Hyperkalemia- give gentle IV fluid and recheck in a.m.  All the records are reviewed and case discussed with ED provider. Management plans discussed with the patient, family and they are in agreement.  CODE STATUS: DO NOT RESUSCITATE  TOTAL TIME TAKING CARE OF THIS PATIENT: 50 minutes.    Alford Highland M.D on 02/01/2015 at 11:44 PM  Between 7am to 6pm - Pager - [phone]  After 6pm call admission pager [phone]  [facility]  Office  [phone]  CC: Primary care physician; Corky Downs, MD

## 2015-02-01 NOTE — ED Provider Notes (Signed)
[facility] Emergency Department Provider Note  ____________________________________________  Time seen: Approximately 8:03 PM  I have reviewed the triage vital signs and the nursing notes.   HISTORY  Chief Complaint Weakness and Altered Mental Status    HPI Tracy Le is a 82 y.o. female who is otherwise healthy and not on any medications presenting with altered mental status. Patient denies any recent confusion but her family members with whom she lives states that over the past 2-3 days she has had periods of lucidity followed by periods where she is "talking out of her head." They describe that she told him to "put wood on the fire," and that she is throwing her covers all over the place. She is also no longer able to walk when her baseline is that she is able to ambulate from one room to another with a walker. There are no other focal symptoms including chest pain, shortness of breath, nausea or vomiting, fever, chills, diarrhea, abdominal pain, headache, numbness weakness or tingling.No known history of trauma or medications.   History reviewed. No pertinent past medical history.  There are no active problems to display for this patient.   Past Surgical History  Procedure Laterality Date  . Cholecystectomy      Current Outpatient Rx  Name  Route  Sig  Dispense  Refill  . cephALEXin (KEFLEX) 500 MG capsule   Oral   Take 1 capsule (500 mg total) by mouth 4 (four) times daily. Patient not taking: Reported on 02/01/2015   28 capsule   0     Allergies Review of patient's allergies indicates no known allergies.  No family history on file.  Social History Social History  Substance Use Topics  . Smoking status: Never Smoker   . Smokeless tobacco: None  . Alcohol Use: No    Review of Systems Constitutional: No fever/chills. No lightheadedness or syncope. Eyes: No visual changes. No blurred or double vision. ENT: No sore  throat. Cardiovascular: Denies chest pain, palpitations. Respiratory: Denies shortness of breath.  No cough. Gastrointestinal: No abdominal pain.  No nausea, no vomiting.  No diarrhea.  No constipation. Genitourinary: Negative for dysuria. Musculoskeletal: Negative for back pain. Positive for "crippling arthritis." Skin: Negative for rash. Neurological: Negative for headaches, focal weakness or numbness. Positive for altered mental status.  10-point ROS otherwise negative.  ____________________________________________   PHYSICAL EXAM:  VITAL SIGNS: ED Triage Vitals  Enc Vitals Group     BP 02/01/15 1644 123/80 mmHg     Pulse Rate 02/01/15 1644 78     Resp 02/01/15 1644 16     Temp 02/01/15 1644 98.2 F (36.8 C)     Temp Source 02/01/15 1644 Oral     SpO2 02/01/15 1644 94 %     Weight --      Height --      Head Cir --      Peak Flow --      Pain Score --      Pain Loc --      Pain Edu? --      Excl. in GC? --     Constitutional: Alert and oriented x 3. Well appearing and in no acute distress. Answer question appropriately. Eyes: Conjunctivae are normal.  EOMI. scleral icterus. Head: Atraumatic. Nose: No congestion/rhinnorhea. Mouth/Throat: Mucous membranes are moist.  Neck: No stridor.  Supple.  No JVD Cardiovascular: Normal rate, regular rhythm. No murmurs, rubs or gallops.  Respiratory: Normal respiratory effort.  No retractions. Lungs CTAB.  No wheezes, rales or ronchi. Gastrointestinal: Obese. Soft and nontender. No distention. No peritoneal signs. Musculoskeletal: No LE edema. No palpable cords or tenderness to palpation in the calves. Neurologic:  Patient is significantly hearing impaired but is able to answer questions appropriately. She is alert and oriented 3. Speech is clear. Face and smile are symmetric. Moves all extremities well. Skin:  Skin is warm, dry and intact. No rash noted. Psychiatric: Mood and affect are normal. Speech and behavior are normal.   Normal judgement.  ____________________________________________   LABS (all labs ordered are listed, but only abnormal results are displayed)  Labs Reviewed  COMPREHENSIVE METABOLIC PANEL - Abnormal; Notable for the following:    Potassium 5.4 (*)    Chloride 114 (*)    CO2 19 (*)    BUN 40 (*)    Creatinine, Ser 2.43 (*)    GFR calc non Af Amer 15 (*)    GFR calc Af Amer 18 (*)    All other components within normal limits  CBC - Abnormal; Notable for the following:    RBC 3.78 (*)    Hemoglobin 11.6 (*)    All other components within normal limits  URINALYSIS COMPLETEWITH MICROSCOPIC ([facility] ONLY) - Abnormal; Notable for the following:    Color, Urine YELLOW (*)    APPearance CLOUDY (*)    Hgb urine dipstick 1+ (*)    Protein, ur 30 (*)    Leukocytes, UA 3+ (*)    Bacteria, UA MANY (*)    Squamous Epithelial / LPF 0-5 (*)    All other components within normal limits  TROPONIN I - Abnormal; Notable for the following:    Troponin I 0.16 (*)    All other components within normal limits   ____________________________________________  EKG  ED ECG REPORT I, Rockne Menghini, the attending physician, personally viewed and interpreted this ECG.   Date: 02/01/2015  Normal sinus rhythm with poor baseline tracing. No ST elevation.  ____________________________________________  RADIOLOGY  Ct Head Wo Contrast  02/01/2015  CLINICAL DATA:  Weakness and confusion for 2 days EXAM: CT HEAD WITHOUT CONTRAST TECHNIQUE: Contiguous axial images were obtained from the base of the skull through the vertex without intravenous contrast. COMPARISON:  None. FINDINGS: Bony calvarium is intact. There are chronic atrophic changes as well as changes of white matter ischemia and prior right occipital infarct. No findings to suggest acute hemorrhage, acute infarction or space-occupying mass lesion are noted. IMPRESSION: Chronic atrophic changes as well as chronic ischemic change. Electronically  Signed   By: Alcide Clever M.D.   On: 02/01/2015 20:27    ____________________________________________   PROCEDURES  Procedure(s) performed: None  Critical Care performed: No ____________________________________________   INITIAL IMPRESSION / ASSESSMENT AND PLAN / ED COURSE  Pertinent labs & imaging results that were available during my care of the patient were reviewed by me and considered in my medical decision making (see chart for details).  82 y.o. female who is otherwise healthy presenting with 2-3 days of intermittent altered mental status and now no longer able to ambulate which is a change from baseline. I will evaluate her for urinary tract infection although she denies any urinary symptoms. We'll check for any electrolyte abnormalities. We'll also look for cardiac causes. CT scan of the head is ordered although the patient has no focal neurologic deficits on exam. Anticipate admission as the patient is no longer able to walk and her family has been asking "large  men" to help the move her around the house.  ----------------------------------------- 10:10 PM on 02/01/2015 -----------------------------------------  The patient does have a urinary tract infection with altered mental status and will require admission. I have ordered antibiotics to be initiated in the emergency department. She has a positive troponin but in the setting of no active chest pain, the clinical relevance of this test will be determined by the trend. Plan admission.  ____________________________________________  FINAL CLINICAL IMPRESSION(S) / ED DIAGNOSES  Final diagnoses:  UTI (lower urinary tract infection)  Altered mental status, unspecified altered mental status type  Elevated troponin      NEW MEDICATIONS STARTED DURING THIS VISIT:  New Prescriptions   No medications on file     Rockne Menghini, MD 02/01/15 2312

## 2015-02-02 ENCOUNTER — Inpatient Hospital Stay
Admit: 2015-02-02 | Discharge: 2015-02-02 | Disposition: A | Discharge status: Home / Self Care | Payer: Medicare Other | Attending: Internal Medicine | Admitting: Internal Medicine

## 2015-02-02 DIAGNOSIS — L8990 Pressure ulcer of unspecified site, unspecified stage: Secondary | ICD-10-CM | POA: Insufficient documentation

## 2015-02-02 LAB — BASIC METABOLIC PANEL
ANION GAP: 7 (ref 5–15)
BUN: 40 mg/dL — ABNORMAL HIGH (ref 6–20)
CHLORIDE: 114 mmol/L — AB (ref 101–111)
CO2: 19 mmol/L — AB (ref 22–32)
Calcium: 8.9 mg/dL (ref 8.9–10.3)
Creatinine, Ser: 2.2 mg/dL — ABNORMAL HIGH (ref 0.44–1.00)
GFR calc non Af Amer: 17 mL/min — ABNORMAL LOW (ref >60)
GFR, EST AFRICAN AMERICAN: 20 mL/min — AB (ref >60)
Glucose, Bld: 84 mg/dL (ref 65–99)
POTASSIUM: 4.6 mmol/L (ref 3.5–5.1)
Sodium: 140 mmol/L (ref 135–145)

## 2015-02-02 LAB — CBC
HCT: 34.1 % — ABNORMAL LOW (ref 35.0–47.0)
HEMOGLOBIN: 11.3 g/dL — AB (ref 12.0–16.0)
MCH: 31.6 pg (ref 26.0–34.0)
MCHC: 33.1 g/dL (ref 32.0–36.0)
MCV: 95.5 fL (ref 80.0–100.0)
Platelets: 144 10*3/uL — ABNORMAL LOW (ref 150–440)
RBC: 3.58 MIL/uL — AB (ref 3.80–5.20)
RDW: 13.4 % (ref 11.5–14.5)
WBC: 8.4 10*3/uL (ref 3.6–11.0)

## 2015-02-02 LAB — TROPONIN I
Troponin I: 0.16 ng/mL — ABNORMAL HIGH (ref <0.031)
Troponin I: 0.17 ng/mL — ABNORMAL HIGH (ref <0.031)

## 2015-02-02 MED ORDER — OXYCODONE-ACETAMINOPHEN 5-325 MG PO TABS
ORAL_TABLET | ORAL | Status: AC
  Filled 2015-02-02: qty 1

## 2015-02-02 NOTE — Care Management (Signed)
Admitted to [facility] with the diagnosis of acute encepalopathy . Lives with son and daughter-in-law since 2012. Daughter-in-law is Steward DroneBrenda [phone]). Sees Dr. Juel BurrowMasoud as primary care physician. No home health. No skilled facility. No home oxygen. Uses a rolling walker to aid in ambulation. Daughter-in-law indicated that she could management from the bed to the bathroom with the rolling walker. Ambulating well up to the last 5 days. Last fall was several months ago. Poor appetite lately.  Family requesting physical therapy evaluation for discharge planning  Gwenette GreetBrenda S Jayden Rudge RN MSN CCM Care Management [phone]

## 2015-02-02 NOTE — Evaluation (Addendum)
Physical Therapy Evaluation Patient Details Name: Tracy Le MRN: [id number] DOB: 1913-08-28 Today's Date: 02/02/2015   History of Present Illness  presented to ER secondary to AMS, agitation; admitted with acute encephalopathy due to acute cystitis.  Clinical Impression  Upon evaluation, patient alert and oriented to self only; extremely HOH with noted difficulty following commands (due to confusion vs. HOH).  Bilat UE/LE strength generally weak and deconditioned, 3-/5; shoulder elevation to shoulder height only, L ankle DF lacking approx 5-8 degrees.  Currently requiring total assist +2 for all bed mobility, sit/supine and sitting balance.  Absent functional reach, righting reactions or ability to respond to any movement outside immediate BOS.  Unsafe/unable to attempt any OOB activities at this time. Patient appears to be at/near baseline level of functional ability, total assist for all self-care, ADLs and mobility.  Do not feel patient likely to make significant functional gains with skilled PT services at this time.  If family desires return home, recommend use of hoyer lift, hospital bed to decreased physical burden of care; however, ultimately recommend transition to higher-level of care (LTC) for 24 hour sup/assist at this level of care (family desires placement per discussion).    Follow Up Recommendations  (LTC)    Equipment Recommendations  Hospital bed (hoyer lift) if family pursues return home    Recommendations for Other Services       Precautions / Restrictions Precautions Precautions: Fall Restrictions Weight Bearing Restrictions: No      Mobility  Bed Mobility Overal bed mobility: Needs Assistance;+2 for physical assistance Bed Mobility: Supine to Sit;Sit to Supine     Supine to sit: Total assist;+2 for physical assistance Sit to supine: Total assist;+2 for physical assistance   General bed mobility comments: very limited ability to actively assist with bed  mobility at this time; absent sitting balance/righting reactions once upright, dep for sitting balance  Transfers                 General transfer comment: unable/unsafe  Ambulation/Gait             General Gait Details: unable/unsafe  Stairs            Wheelchair Mobility    Modified Rankin (Stroke Patients Only)       Balance Overall balance assessment: Needs assistance Sitting-balance support: No upper extremity supported;Feet supported Sitting balance-Leahy Scale: Zero   Postural control: Posterior lean                                   Pertinent Vitals/Pain Pain Assessment: Faces Faces Pain Scale: Hurts little more Pain Location: L ankle/knee Pain Descriptors / Indicators: Grimacing Pain Intervention(s): Limited activity within patient's tolerance;Monitored during session;Repositioned    Home Living Family/patient expects to be discharged to:: Private residence Living Arrangements: Children (son and daughter-in-law) Available Help at Discharge: Family (unable to provide current level of care required) Type of Home: House Home Access: Ramped entrance     Home Layout: One level        Prior Function Level of Independence: Needs assistance         Comments: Patient unable to provide any history; info obtained from family members (not the ones she lives with) in room at time of evaluation.  Per family, patient uses manual WC as primary mobility; requires total assist for all functional transfers, ADLs and self-care (feeding included); dep for bowel/bladder with use of  depends.  Family unable to recall last time patient has been ambulatory     Hand Dominance        Extremity/Trunk Assessment   Upper Extremity Assessment: Generalized weakness (grossly 3- to 3/5 throughout bilat UEs, shoulder elevation to shoulder height only (arthritic changes))           Lower Extremity Assessment: Generalized weakness (grossly 3-/5  throughout, mild facial grimacing with act assist of L LE.  R ankle DF to neutral, L lacking approx 5-8 degrees from neutral)         Communication   Communication: HOH  Cognition Arousal/Alertness: Awake/alert Behavior During Therapy: WFL for tasks assessed/performed Overall Cognitive Status: Difficult to assess (patient very HOH and with difficulty following commands; difficut to assess formal orientation/cognition.  Appears oriented to self only)                      General Comments      Exercises        Assessment/Plan    PT Assessment Patent does not need any further PT services  PT Diagnosis     PT Problem List    PT Treatment Interventions     PT Goals (Current goals can be found in the Care Plan section) Acute Rehab PT Goals PT Goal Formulation: Patient unable to participate in goal setting    Frequency     Barriers to discharge        Co-evaluation               End of Session   Activity Tolerance: Other (comment) (limited by cognitive status and inability to actively participate/progress with skilled PT) Patient left: in bed;with bed alarm set;with family/visitor present           Time: 2956-21301441-1505 PT Time Calculation (min) (ACUTE ONLY): 24 min   Charges:   PT Evaluation $PT Eval Moderate Complexity: 1 Procedure     PT G Codes:        Seba Madole H. Manson PasseyBrown, PT, DPT, NCS 02/02/2015, 4:35 PM [phone]  Addendum 02/02/14: discharge recommendation clarified (LTC) and equip recommendations updated should family desire return home vs. LTC Edger Husain H. Manson PasseyBrown, PT, DPT, NCS 02/03/2015, 10:54 AM [phone]

## 2015-02-02 NOTE — Plan of Care (Addendum)
Problem: Education: Goal: Knowledge of [facility] Education information/materials will improve Outcome: Progressing Has received General Education Handout. Oriented and Reoriented to Oncology unit.    Instructed and Demonstrated how to use phone to call RN and CNA for Assistance. Unable. Family remains at bedside.     Problem: Safety: Goal: Ability to remain free from injury will improve Outcome: Progressing High fall risk with bed alarm activated. Instructed and Demonstrated how to use phone to call RN and CNA for Assistance. Unable. Remained free of injury of fall this shift. Family remains at bedside.  Problem: Pain Managment: Goal: General experience of comfort will improve Outcome: Progressing Denies pain. No signs/symptoms of pain noted.  Problem: Physical Regulation: Goal: Ability to maintain clinical measurements within normal limits will improve Outcome: Progressing Vital Signs Stable. WBC's Improving. Remains on IV Antibiotics. Infection prevention measures promoted (hand hygiene). Goal: Will remain free from infection Outcome: Progressing WBC's Improving. Remains on IV Antibiotics. Infection prevention measures promoted (hand hygiene).  Problem: Skin Integrity: Goal: Risk for impaired skin integrity will decrease Outcome: Progressing Skin dry & intact. Sacral foam in place for protection. Stage II pressure ulcer sacral right Stage I pressure ulcer sacral.  Problem: Fluid Volume: Goal: Ability to maintain a balanced intake and output will improve Encouraged Oral Intake.

## 2015-02-02 NOTE — Progress Notes (Signed)
*  PRELIMINARY RESULTS* Echocardiogram 2D Echocardiogram has been performed.  Georgann HousekeeperJerry R Hege 02/02/2015, 9:04 AM

## 2015-02-02 NOTE — Progress Notes (Signed)
[facility] - [place] at [facility]   PATIENT NAME: Tracy Le    MR#:  [id number]  DATE OF BIRTH:  11/11/1913  SUBJECTIVE: Admitted for confusion, and to have UTI. According to the family patient demented and not doing much.Family is considering placement.   CHIEF COMPLAINT:   Chief Complaint  Patient presents with  . Weakness  . Altered Mental Status    REVIEW OF SYSTEMS:   Review of Systems  Unable to perform ROS: dementia     DRUG ALLERGIES:  No Known Allergies  VITALS:  Blood pressure 116/65, pulse 68, temperature 97.4 F (36.3 C), temperature source Oral, resp. rate 18, height 5\' 5"  (1.651 m), weight 72.802 kg (160 lb 8 oz), SpO2 98 %.  PHYSICAL EXAMINATION:  GENERAL:  82 y.o.-year-old patient lying in the bed with no acute distress.  EYES: Pupils equal, round, reactive to light and accommodation. No scleral icterus. Extraocular muscles intact.  HEENT: Head atraumatic, normocephalic. Oropharynx and nasopharynx clear.  NECK:  Supple, no jugular venous distention. No thyroid enlargement, no tenderness.  LUNGS: Normal breath sounds bilaterally, no wheezing, rales,rhonchi or crepitation. No use of accessory muscles of respiration.  CARDIOVASCULAR: S1, S2 normal. No murmurs, rubs, or gallops.  ABDOMEN: Soft, nontender, nondistended. Bowel sounds present. No organomegaly or mass.  EXTREMITIES: No pedal edema, cyanosis, or clubbing.  NEUROLOGIC: Not able to follow full commands secondary to dementia. PSYCHIATRIC: Demented. SKIN: No obvious rash, lesion, or ulcer.    LABORATORY PANEL:   CBC  Recent Labs Lab 02/02/15 0503  WBC 8.4  HGB 11.3*  HCT 34.1*  PLT 144*   ------------------------------------------------------------------------------------------------------------------  Chemistries   Recent Labs Lab 02/01/15 1647 02/02/15 0503  NA 139 140  K 5.4* 4.6  CL 114* 114*  CO2 19* 19*  GLUCOSE 95 84  BUN 40* 40*  CREATININE  2.43* 2.20*  CALCIUM 9.0 8.9  AST 27  --   ALT 16  --   ALKPHOS 77  --   BILITOT 0.4  --    ------------------------------------------------------------------------------------------------------------------  Cardiac Enzymes  Recent Labs Lab 02/02/15 0503  TROPONINI 0.16*   ------------------------------------------------------------------------------------------------------------------  RADIOLOGY:  Ct Head Wo Contrast  02/01/2015  CLINICAL DATA:  Weakness and confusion for 2 days EXAM: CT HEAD WITHOUT CONTRAST TECHNIQUE: Contiguous axial images were obtained from the base of the skull through the vertex without intravenous contrast. COMPARISON:  None. FINDINGS: Bony calvarium is intact. There are chronic atrophic changes as well as changes of white matter ischemia and prior right occipital infarct. No findings to suggest acute hemorrhage, acute infarction or space-occupying mass lesion are noted. IMPRESSION: Chronic atrophic changes as well as chronic ischemic change. Electronically Signed   By: Alcide Clever M.D.   On: 02/01/2015 20:27    EKG:   Orders placed or performed during the hospital encounter of 02/01/15  . ED EKG  . ED EKG  . EKG 12-Lead  . EKG 12-Lead  . ED EKG  . ED EKG    ASSESSMENT AND PLAN:   1 . Acute encephalopathy with acute cystitis with hematuria: Continue Rocephin. #2 weakness;; physical therapy evaluation #3 advanced dementia #4.elevated troponin;not a candidate for any work up due to advanced therapy Spoke to family,considering placement, 5. All the records are reviewed and case discussed with Care Management/Social Workerr. Management plans discussed with the patient, family and they are in agreement.  CODE STATUS: DNR  TOTAL TIME TAKING CARE OF THIS PATIENT: .   POSSIBLE D/C IN  1-2 DAYS, DEPENDING ON CLINICAL CONDITION.   Katha HammingKONIDENA,Emoni Whitworth M.D on 02/02/2015 at 12:36 PM  Between 7am to 6pm - Pager - [phone]  After 6pm go to  [email or web address] - password [email or web address] [facility]  [facility]  Office  [phone]  CC: Primary care physician; Corky DownsMASOUD, JAVED, MD   Note: This dictation was prepared with Dragon dictation along with smaller phrase technology. Any transcriptional errors that result from this process are unintentional.

## 2015-02-02 NOTE — Plan of Care (Signed)
Problem: Safety: Goal: Ability to remain free from injury will improve Outcome: Progressing On high fall precaution per policy.  Problem: Pain Managment: Goal: General experience of comfort will improve Outcome: Progressing No complaints of pain.  Problem: Physical Regulation: Goal: Ability to maintain clinical measurements within normal limits will improve Outcome: Progressing Continues ABX.  Problem: Skin Integrity: Goal: Risk for impaired skin integrity will decrease Outcome: Progressing Form dressing applied to sacral area.  Problem: Urinary Elimination: Goal: Signs and symptoms of infection will decrease Outcome: Progressing Incontinent of urine.

## 2015-02-03 MED ORDER — HEPARIN SODIUM (PORCINE) 5000 UNIT/ML IJ SOLN: 5000.0000 [IU] | Freq: Two times a day (BID) | INTRAMUSCULAR | Status: DC

## 2015-02-03 MED ORDER — HEPARIN SODIUM (PORCINE) 5000 UNIT/ML IJ SOLN
5000.0000 [IU] | Freq: Two times a day (BID) | INTRAMUSCULAR | Status: DC
  Administered 2015-02-03 – 2015-02-07 (×8): 5000 [IU] via SUBCUTANEOUS
  Filled 2015-02-03 (×9): qty 1

## 2015-02-03 NOTE — Clinical Social Work Placement (Signed)
   CLINICAL SOCIAL WORK PLACEMENT  NOTE  Date:  02/03/2015  Patient Details  Name: Tracy Le MRN: [id number] Date of Birth: 01/31/1913  Clinical Social Work is seeking post-discharge placement for this patient at the Skilled  Nursing Facility level of care (*CSW will initial, date and re-position this form in  chart as items are completed):  Yes   Patient/family provided with [facility] Clinical Social Work Department's list of facilities offering this level of care within the geographic area requested by the patient (or if unable, by the patient's family).  Yes   Patient/family informed of their freedom to choose among providers that offer the needed level of care, that participate in Medicare, Medicaid or managed care program needed by the patient, have an available bed and are willing to accept the patient.  Yes   Patient/family informed of [facility]'s ownership interest in [facility] and [facility], as well as of the fact that they are under no obligation to receive care at these facilities.  PASRR submitted to EDS on 02/03/15     PASRR number received on 02/03/15     Existing PASRR number confirmed on       FL2 transmitted to all facilities in geographic area requested by pt/family on 02/03/15     FL2 transmitted to all facilities within larger geographic area on       Patient informed that his/her managed care company has contracts with or will negotiate with certain facilities, including the following:            Patient/family informed of bed offers received.  Patient chooses bed at       Physician recommends and patient chooses bed at      Patient to be transferred to   on  .  Patient to be transferred to facility by       Patient family notified on   of transfer.  Name of family member notified:        PHYSICIAN       Additional Comment:    _______________________________________________ Dede QuerySarah Ivannah Zody, LCSW 02/03/2015, 2:07 PM

## 2015-02-03 NOTE — Progress Notes (Signed)
[facility] [place] at [facility]   PATIENT NAME: Tracy OsierLena Le    MR#:  [id number]  DATE OF BIRTH:  August 22, 1913  SUBJECTIVE: Admitted for confusion,  And  UTI. According to the family patient demented and not doing much.Family is considering placement.   CHIEF COMPLAINT:   Chief Complaint  Patient presents with  . Weakness  . Altered Mental Status    REVIEW OF SYSTEMS:   Review of Systems  Unable to perform ROS: dementia     DRUG ALLERGIES:  No Known Allergies  VITALS:  Blood pressure 140/74, pulse 90, temperature 97.9 F (36.6 C), temperature source Oral, resp. rate 17, height 5\' 5"  (1.651 m), weight 72.802 kg (160 lb 8 oz), SpO2 95 %.  PHYSICAL EXAMINATION:  GENERAL:  82 y.o.-year-old patient lying in the bed with no acute distress.  EYES: Pupils equal, round, reactive to light and accommodation. No scleral icterus. Extraocular muscles intact.  HEENT: Head atraumatic, normocephalic. Oropharynx and nasopharynx clear.  NECK:  Supple, no jugular venous distention. No thyroid enlargement, no tenderness.  LUNGS: Normal breath sounds bilaterally, no wheezing, rales,rhonchi or crepitation. No use of accessory muscles of respiration.  CARDIOVASCULAR: S1, S2 normal. No murmurs, rubs, or gallops.  ABDOMEN: Soft, nontender, nondistended. Bowel sounds present. No organomegaly or mass.  EXTREMITIES: No pedal edema, cyanosis, or clubbing.  NEUROLOGIC: Not able to follow full commands secondary to dementia. PSYCHIATRIC: Demented. SKIN: No obvious rash, lesion, or ulcer.    LABORATORY PANEL:   CBC  Recent Labs Lab 02/02/15 0503  WBC 8.4  HGB 11.3*  HCT 34.1*  PLT 144*   ------------------------------------------------------------------------------------------------------------------  Chemistries   Recent Labs Lab 02/01/15 1647 02/02/15 0503  NA 139 140  K 5.4* 4.6  CL 114* 114*  CO2 19* 19*  GLUCOSE 95 84  BUN 40* 40*  CREATININE 2.43*  2.20*  CALCIUM 9.0 8.9  AST 27  --   ALT 16  --   ALKPHOS 77  --   BILITOT 0.4  --    ------------------------------------------------------------------------------------------------------------------  Cardiac Enzymes  Recent Labs Lab 02/02/15 0503  TROPONINI 0.16*   ------------------------------------------------------------------------------------------------------------------  RADIOLOGY:  Ct Head Wo Contrast  02/01/2015  CLINICAL DATA:  Weakness and confusion for 2 days EXAM: CT HEAD WITHOUT CONTRAST TECHNIQUE: Contiguous axial images were obtained from the base of the skull through the vertex without intravenous contrast. COMPARISON:  None. FINDINGS: Bony calvarium is intact. There are chronic atrophic changes as well as changes of white matter ischemia and prior right occipital infarct. No findings to suggest acute hemorrhage, acute infarction or space-occupying mass lesion are noted. IMPRESSION: Chronic atrophic changes as well as chronic ischemic change. Electronically Signed   By: Alcide CleverMark  Lukens M.D.   On: 02/01/2015 20:27    EKG:   Orders placed or performed during the hospital encounter of 02/01/15  . ED EKG  . ED EKG  . EKG 12-Lead  . EKG 12-Lead  . ED EKG  . ED EKG    ASSESSMENT AND PLAN:   1 . Acute encephalopathy with acute cystitis with hematuria: urine cultures negative.Continue Rocephin. #2 weakness;; physical therapy evaluation,and possible  Placement/ #3 advanced dementia #4.elevated troponin;not a candidate for any work up due to advanced therapy Spoke to family,considering placement, 5. All the records are reviewed and case discussed with Care Management/Social Workerr. Management plans discussed with the patient, family and they are in agreement.  CODE STATUS: DNR  TOTAL TIME TAKING CARE OF THIS PATIENT: 35minutes.  POSSIBLE D/C IN 1-2 DAYS, DEPENDING ON CLINICAL CONDITION.   Katha Hamming M.D on 02/03/2015 at 9:23 AM  Between 7am to 6pm  - Pager - [phone]  After 6pm go to [email or web address] - password [email or web address] [facility]  [place] [facility]  Office  [phone]  CC: Primary care physician; Corky Downs, MD   Note: This dictation was prepared with Dragon dictation along with smaller phrase technology. Any transcriptional errors that result from this process are unintentional.

## 2015-02-03 NOTE — Plan of Care (Addendum)
Problem: Pain Managment: Goal: General experience of comfort will improve Outcome: Progressing Pt has been sleepy most of the shift. Pt family declined Benadryl at bedtime, they said she was so sleepy during the day yesterday and they didn't think she needed it. No complaints of pain.   Problem: Physical Regulation: Goal: Will remain free from infection Outcome: Progressing Pt afebrile during the night. WBC count 8.4 yesterday. Pt on IV antibiotics of tx of uti.   Problem: Skin Integrity: Goal: Risk for impaired skin integrity will decrease Outcome: Progressing Positioned for comfort. High fall risk per policy. Family very involved and someone remained at bedside.

## 2015-02-03 NOTE — NC FL2 (Signed)
  [place] MEDICAID FL2 LEVEL OF CARE SCREENING TOOL     IDENTIFICATION  Patient Name: Tracy Le Birthdate: [birth year] Sex: female Admission Date (Current Location): 02/01/2015  [place]   Facility and Address:  [facility], [address], [place], [place] [address]      Provider Number: [id number]  Attending Physician Name and Address:  Katha HammingSnehalatha Konidena, MD  Relative Name and Phone Number:       Current Level of Care: Hospital Recommended Level of Care: Nursing Facility Prior Approval Number:    Date Approved/Denied:   PASRR Number: [id number]  Discharge Plan: SNF    Current Diagnoses: Patient Active Problem List   Diagnosis Date Noted  . Pressure ulcer 02/02/2015  . Acute encephalopathy 02/01/2015    Orientation RESPIRATION BLADDER Height & Weight    Self  Normal Incontinent 5\' 5"  (165.1 cm) 160 lbs.  BEHAVIORAL SYMPTOMS/MOOD NEUROLOGICAL BOWEL NUTRITION STATUS      Incontinent Diet (2 gram Sodium)  AMBULATORY STATUS COMMUNICATION OF NEEDS Skin   Extensive Assist Verbally Normal                       Personal Care Assistance Level of Assistance  Bathing, Feeding, Dressing Bathing Assistance: Maximum assistance Feeding assistance: Maximum assistance Dressing Assistance: Maximum assistance     Functional Limitations Info  Sight, Hearing, Speech Sight Info: Impaired Hearing Info: Impaired Speech Info: Impaired    SPECIAL CARE FACTORS FREQUENCY                       Contractures      Additional Factors Info  Code Status, Allergies Code Status Info: DNR Allergies Info: No known allergies           Current Medications (02/03/2015):  This is the current hospital active medication list Current Facility-Administered Medications  Medication Dose Route Frequency Provider Last Rate Last Dose  . acetaminophen (TYLENOL) tablet 650 mg  650 mg Oral Q6H PRN Alford Highlandichard Wieting, MD       Or  . acetaminophen (TYLENOL) suppository 650 mg  650 mg Rectal Q6H PRN Alford Highlandichard Wieting, MD      . aspirin EC tablet 81 mg  81 mg Oral Daily Alford Highlandichard Wieting, MD   81 mg at 02/03/15 0956  . cefTRIAXone (ROCEPHIN) 1 g in dextrose 5 % 50 mL IVPB  1 g Intravenous Q24H Alford Highlandichard Wieting, MD   1 g at 02/02/15 2022  . diphenhydrAMINE (BENADRYL) capsule 25 mg  25 mg Oral QHS Alford Highlandichard Wieting, MD   25 mg at 02/02/15 0057  . heparin injection 5,000 Units  5,000 Units Subcutaneous Q12H Katha HammingSnehalatha Konidena, MD      . sodium chloride 0.9 % injection 3 mL  3 mL Intravenous Q12H Alford Highlandichard Wieting, MD   3 mL at 02/03/15 [id number]     Discharge Medications: Please see discharge summary for a list of discharge medications.  Relevant Imaging Results:  Relevant Lab Results:   Additional Information SSN:  [id number]  Dede QuerySarah Dionicio Shelnutt, LCSW

## 2015-02-03 NOTE — Clinical Social Work Note (Signed)
Clinical Social Work Assessment  Patient Details  Name: Tracy Le MRN: [id number] Date of Birth: Jul 11, 1913  Date of referral:  02/03/15               Reason for consult:  Facility Placement                Permission sought to share information with:  Family Supports Permission granted to share information::  Yes, Verbal Permission Granted  Name::     son and daughter   Housing/Transportation Living arrangements for the past 2 months:  Single Family Home Source of Information:  Adult Children Patient Interpreter Needed:  None Criminal Activity/Legal Involvement Pertinent to Current Situation/Hospitalization:  No - Comment as needed Significant Relationships:  Adult Children Lives with:  Adult Children Do you feel safe going back to the place where you live?  Yes Need for family participation in patient care:  Yes (Comment)  Care giving concerns:  Pt needs LTC at [facility].   Social Worker assessment / plan:  CSW met with pt, son, and daughter in law to address consult. CSW introduced herself and explained role of social work. CSW also explained process of discharging to SNF for LTC. Pt has Medicaid a LTC payor source. Pt's family is agreeable to discharge plan. CSW initiated bed search and will follow up with bed offers. CSW will continue to follow.   Employment status:  Retired Forensic scientist:  Medicaid In [place], [place] PT Recommendations:  [place] / Referral to community resources:  [place]  Patient/Family's Response to care: Pt's family was appreciative of CSW support.   Patient/Family's Understanding of and Emotional Response to Diagnosis, Current Treatment, and Prognosis:  Pt's family understands that pt needs a higher level of care.   Emotional Assessment Appearance:  Appears stated age Attitude/Demeanor/Rapport:  Other (Appropriate) Affect (typically observed):  Pleasant Orientation:  Oriented to Self Alcohol /  Substance use:  Never Used Psych involvement (Current and /or in the community):  No (Comment)  Discharge Needs  Concerns to be addressed:  Other (Comment Required (Long term care) Readmission within the last 30 days:  No Current discharge risk:  None Barriers to Discharge:  Continued Medical Work up   [employer], LCSW 02/03/2015, 2:16 PM

## 2015-02-03 NOTE — Care Management Important Message (Signed)
Important Message  Patient Details  Name: Tracy Le MRN: [id number] Date of Birth: [birth year]   Medicare Important Message Given:  Yes    Olegario MessierKathy A [facility] 02/03/2015, 10:48 AM

## 2015-02-03 NOTE — Progress Notes (Signed)
Patient CrCl has dropped below 15 ml/min (13.399ml/min). Per protocol patient will be transitioned from lovenox 30mg  daily to heparin 5000 units BID.  Olene FlossMelissa D Jaymin Waln, Pharm.D Clinical Pharmacist

## 2015-02-03 NOTE — Plan of Care (Signed)
Problem: Education: Goal: Knowledge of [facility] Education information/materials will improve Outcome: Progressing Has received General Education Handout. Oriented and Reoriented to Oncology unit.     Instructed and Demonstrated how to use phone to call RN and CNA for Assistance. Unable. Family remains at bedside.  Problem: Safety: Goal: Ability to remain free from injury will improve Outcome: Progressing High fall risk with bed alarm activated. Instructed and Demonstrated how to use phone to call RN and CNA for Assistance. Unable. Remained free of injury of fall this shift. Family remains at bedside.     Problem: Pain Managment: Goal: General experience of comfort will improve Outcome: Progressing Denies pain. No signs/symptoms of pain noted.  Problem: Physical Regulation: Goal: Ability to maintain clinical measurements within normal limits will improve Outcome: Progressing Vital Signs Stable. WBC's Improving. Remains on IV Antibiotics. Infection prevention measures promoted (hand hygiene). Goal: Will remain free from infection Outcome: Progressing WBC's Improving. Remains on IV Antibiotics. Infection prevention measures promoted (hand hygiene).  Problem: Skin Integrity: Goal: Risk for impaired skin integrity will decrease Outcome: Progressing Stage II pressure ulcer sacral right Stage I pressure ulcer sacral  Skin dry & intact. Sacral foam in place for protection. Encouraged Oral Intake.     Problem: Education: Goal: Knowledge of treatment and prevention of UTI/Pyleonephritis will improve Outcome: Progressing Encouraged Oral Intake.  Problem: Urinary Elimination: Goal: Signs and symptoms of infection will decrease Outcome: Progressing Incontinent of B&B. Q2 hr rounds.

## 2015-02-04 MED ORDER — METHYLPREDNISOLONE SODIUM SUCC 125 MG IJ SOLR
60.0000 mg | INTRAMUSCULAR | Status: DC
Start: 2015-02-04 — End: 2015-02-05
  Administered 2015-02-04: 12:00:00 60 mg via INTRAVENOUS
  Filled 2015-02-04: qty 2

## 2015-02-04 NOTE — Progress Notes (Signed)
[facility] [place] [facility]   PATIENT NAME: Tracy OsierLena Le    MR#:  [id number]  DATE OF BIRTH:  April 02, 1913  SUBJECTIVE: Patient admitted for confusion, found to have dementia only but no UTI. Waiting for placement.   CHIEF COMPLAINT:   Chief Complaint  Patient presents with  . Weakness  . Altered Mental Status    REVIEW OF SYSTEMS:   Review of Systems  Unable to perform ROS: dementia     DRUG ALLERGIES:  No Known Allergies  VITALS:  Blood pressure 110/65, pulse 86, temperature 97.8 F (36.6 C), temperature source Oral, resp. rate 16, height 5\' 5"  (1.651 m), weight 72.802 kg (160 lb 8 oz), SpO2 99 %.  PHYSICAL EXAMINATION:  GENERAL:  82 y.o.-year-old patient lying in the bed with no acute distress.  EYES: Pupils equal, round, reactive to light and accommodation. No scleral icterus. Extraocular muscles intact.  HEENT: Head atraumatic, normocephalic. Oropharynx and nasopharynx clear.  NECK:  Supple, no jugular venous distention. No thyroid enlargement, no tenderness.  LUNGS: Normal breath sounds bilaterally, no wheezing, rales,rhonchi or crepitation. No use of accessory muscles of respiration.  CARDIOVASCULAR: S1, S2 normal. No murmurs, rubs, or gallops.  ABDOMEN: Soft, nontender, nondistended. Bowel sounds present. No organomegaly or mass.  EXTREMITIES: No pedal edema, cyanosis, or clubbing.  NEUROLOGIC: Not able to follow full commands secondary to dementia. PSYCHIATRIC: Demented. SKIN: No obvious rash, lesion, or ulcer.    LABORATORY PANEL:   CBC  Recent Labs Lab 02/02/15 0503  WBC 8.4  HGB 11.3*  HCT 34.1*  PLT 144*   ------------------------------------------------------------------------------------------------------------------  Chemistries   Recent Labs Lab 02/01/15 1647 02/02/15 0503  NA 139 140  K 5.4* 4.6  CL 114* 114*  CO2 19* 19*  GLUCOSE 95 84  BUN 40* 40*  CREATININE 2.43* 2.20*  CALCIUM 9.0 8.9  AST 27   --   ALT 16  --   ALKPHOS 77  --   BILITOT 0.4  --    ------------------------------------------------------------------------------------------------------------------  Cardiac Enzymes  Recent Labs Lab 02/02/15 0503  TROPONINI 0.16*   ------------------------------------------------------------------------------------------------------------------  RADIOLOGY:  No results found.  EKG:   Orders placed or performed during the hospital encounter of 02/01/15  . ED EKG  . ED EKG  . EKG 12-Lead  . EKG 12-Lead  . ED EKG  . ED EKG    ASSESSMENT AND PLAN:   1 . Acute encephalopathy with acute cystitis with hematuria: urine cultures negative.Continue Rocephin. #2 weakness;;   #3 advanced dementia; waiting for placement,. #4.elevated troponin;not a candidate for any work up due to advanced therapy Spoke to family,considering placement, 5. All the records are reviewed and case discussed with Care Management/Social Workerr. Management plans discussed with the patient, family and they are in agreement.  CODE STATUS: DNR  TOTAL TIME TAKING CARE OF THIS PATIENT: 35minutes.   POSSIBLE D/C IN 1-2 DAYS, DEPENDING ON CLINICAL CONDITION.   Katha HammingKONIDENA,Janiel Crisostomo M.D on 02/04/2015 at 11:33 AM  Between 7am to 6pm - Pager - [phone]  After 6pm go to [email or web address] - password [email or web address] [email or web address]  [facility]  Office  [phone]  CC: Primary care physician; Corky DownsMASOUD, JAVED, MD   Note: This dictation was prepared with Dragon dictation along with smaller phrase technology. Any transcriptional errors that result from this process are unintentional.

## 2015-02-04 NOTE — Plan of Care (Signed)
Problem: Safety: Goal: Ability to remain free from injury will improve Outcome: Progressing Pt remains free from falls.  Falls precautions in place.  Family at bedside.  Problem: Pain Managment: Goal: General experience of comfort will improve Outcome: Progressing Pt has complained of no pain this shift.  She has slept and seemed comfortable.  Problem: Skin Integrity: Goal: Risk for impaired skin integrity will decrease Outcome: Progressing Foam dressing changed and maintenance of dry skin in place.

## 2015-02-05 LAB — CBC
HCT: 34.3 % — ABNORMAL LOW (ref 35.0–47.0)
Hemoglobin: 11.2 g/dL — ABNORMAL LOW (ref 12.0–16.0)
MCH: 30.3 pg (ref 26.0–34.0)
MCHC: 32.7 g/dL (ref 32.0–36.0)
MCV: 92.7 fL (ref 80.0–100.0)
PLATELETS: 172 10*3/uL (ref 150–440)
RBC: 3.7 MIL/uL — AB (ref 3.80–5.20)
RDW: 13.5 % (ref 11.5–14.5)
WBC: 9.9 10*3/uL (ref 3.6–11.0)

## 2015-02-05 LAB — URINE CULTURE: SPECIAL REQUESTS: NORMAL

## 2015-02-05 MED ORDER — LORAZEPAM 0.5 MG PO TABS
0.2500 mg | ORAL_TABLET | Freq: Once | ORAL | Status: AC
  Administered 2015-02-05: 0.25 mg via ORAL
  Filled 2015-02-05: qty 1

## 2015-02-05 NOTE — Progress Notes (Signed)
No c/o pain this shift VSS Continues on IV ABX Possible d/c to SNF on Monday Speech continues garbled at baseline

## 2015-02-05 NOTE — Progress Notes (Signed)
[facility] [place] at [facility]   PATIENT NAME: Tracy OsierLena Le    MR#:  [id number]  DATE OF BIRTH:  Jul 06, 1913  SUBJECTIVE: Patient admitted for confusion, found to have dementia only but no UTI. Waiting for placement. No new concerns today.   CHIEF COMPLAINT:   Chief Complaint  Patient presents with  . Weakness  . Altered Mental Status    REVIEW OF SYSTEMS:   Review of Systems  Unable to perform ROS: dementia     DRUG ALLERGIES:  No Known Allergies  VITALS:  Blood pressure 126/65, pulse 85, temperature 98.9 F (37.2 C), temperature source Oral, resp. rate 18, height 5\' 5"  (1.651 m), weight 72.802 kg (160 lb 8 oz), SpO2 98 %.  PHYSICAL EXAMINATION:  GENERAL:  82 y.o.-year-old patient lying in the bed with no acute distress.  EYES: Pupils equal, round, reactive to light and accommodation. No scleral icterus. Extraocular muscles intact.  HEENT: Head atraumatic, normocephalic. Oropharynx and nasopharynx clear.  NECK:  Supple, no jugular venous distention. No thyroid enlargement, no tenderness.  LUNGS: Normal breath sounds bilaterally, no wheezing, rales,rhonchi or crepitation. No use of accessory muscles of respiration.  CARDIOVASCULAR: S1, S2 normal. No murmurs, rubs, or gallops.  ABDOMEN: Soft, nontender, nondistended. Bowel sounds present. No organomegaly or mass.  EXTREMITIES: No pedal edema, cyanosis, or clubbing.  NEUROLOGIC: Not able to follow full commands secondary to dementia. PSYCHIATRIC: Demented. SKIN: No obvious rash, lesion, or ulcer.    LABORATORY PANEL:   CBC  Recent Labs Lab 02/05/15 0557  WBC 9.9  HGB 11.2*  HCT 34.3*  PLT 172   ------------------------------------------------------------------------------------------------------------------  Chemistries   Recent Labs Lab 02/01/15 1647 02/02/15 0503  NA 139 140  K 5.4* 4.6  CL 114* 114*  CO2 19* 19*  GLUCOSE 95 84  BUN 40* 40*  CREATININE 2.43* 2.20*   CALCIUM 9.0 8.9  AST 27  --   ALT 16  --   ALKPHOS 77  --   BILITOT 0.4  --    ------------------------------------------------------------------------------------------------------------------  Cardiac Enzymes  Recent Labs Lab 02/02/15 0503  TROPONINI 0.16*   ------------------------------------------------------------------------------------------------------------------  RADIOLOGY:  No results found.  EKG:   Orders placed or performed during the hospital encounter of 02/01/15  . ED EKG  . ED EKG  . EKG 12-Lead  . EKG 12-Lead  . ED EKG  . ED EKG    ASSESSMENT AND PLAN:   1 . Acute encephalopathy with acute cystitis with hematuria: urine cultures negative.Continue Rocephin.,no need for abx at d/c.Marland Kitchen.discontinue IV steroids/ D/c tele, #2 weakness;d/c to SNF.  #3 advanced dementia; waiting for placement,.famioy says pt is actually [age] yr old not 89 yrs.  #4.elevated troponin;not a candidate for any work up due to advanced therapy Spoke to family,considering placement,  All the records are reviewed and case discussed with Care Management/Social Workerr. Management plans discussed with the patient, family and they are in agreement.  CODE STATUS: DNR  TOTAL TIME TAKING CARE OF THIS PATIENT: 35minutes.   POSSIBLE D/C IN 1-2 DAYS, DEPENDING ON CLINICAL CONDITION.   Katha HammingKONIDENA,Tracy Le M.D on 02/05/2015 at 8:35 AM  Between 7am to 6pm - Pager - [phone]  After 6pm go to [email or web address] - password [email or web address] [facility]  [facility]  Office  [phone]  CC: Primary care physician; Corky DownsMASOUD, JAVED, MD   Note: This dictation was prepared with Dragon dictation along with smaller phrase technology. Any transcriptional errors that result from this process are unintentional.

## 2015-02-05 NOTE — Plan of Care (Signed)
Problem: Education: Goal: Knowledge of [facility] Education information/materials will improve Outcome: Progressing Oriented to unit and how to contact RN  Problem: Safety: Goal: Ability to remain free from injury will improve Outcome: Progressing High fall risk.  Pt has fall band on, bed alarm in use and frequent rounds  Problem: Pain Managment: Goal: General experience of comfort will improve Outcome: Progressing No complaints of pain  Problem: Physical Regulation: Goal: Ability to maintain clinical measurements within normal limits will improve Outcome: Progressing VSS  Problem: Skin Integrity: Goal: Risk for impaired skin integrity will decrease Outcome: Progressing Pt repositioned q 2 hours.  Problem: Fluid Volume: Goal: Ability to maintain a balanced intake and output will improve Outcome: Progressing Tolerating fluids and diet  Problem: Education: Goal: Knowledge of treatment and prevention of UTI/Pyleonephritis will improve Outcome: Progressing Pt incontinent of urine.  Family aware of need to frequently offer toileting to pt.  Problem: Urinary Elimination: Goal: Signs and symptoms of infection will decrease Outcome: Progressing Afebrile

## 2015-02-05 NOTE — Progress Notes (Signed)
Spoke with Dr. Anne HahnWillis about pt being awake and constantly talking.  Pt's family concerned about her not resting.  Dr. Anne HahnWillis put in order for ativan.  Orson Apeanielle Shyanna Klingel, RN

## 2015-02-06 MED ORDER — CIPROFLOXACIN HCL 250 MG PO TABS: 250.0000 mg | ORAL_TABLET | Freq: Two times a day (BID) | ORAL | Status: AC

## 2015-02-06 MED ORDER — ASPIRIN 81 MG PO TBEC: 81.0000 mg | DELAYED_RELEASE_TABLET | Freq: Every day | ORAL | Status: AC

## 2015-02-06 MED ORDER — LORAZEPAM 2 MG/ML IJ SOLN
0.5000 mg | Freq: Once | INTRAMUSCULAR | Status: AC
  Administered 2015-02-06: 0.5 mg via INTRAVENOUS
  Filled 2015-02-06: qty 1

## 2015-02-06 NOTE — Progress Notes (Signed)
[facility] [place] [facility]   PATIENT NAME: Tracy OsierLena Le    MR#:  [id number]  DATE OF BIRTH:  01/28/1914  SUBJECTIVE: Patient admitted for confusion, found to have dementia only but no UTI. Waiting for placement. No new concerns today.   CHIEF COMPLAINT:   Chief Complaint  Patient presents with  . Weakness  . Altered Mental Status    REVIEW OF SYSTEMS:   Review of Systems  Unable to perform ROS: dementia     DRUG ALLERGIES:  No Known Allergies  VITALS:  Blood pressure 128/90, pulse 93, temperature 98.2 F (36.8 C), temperature source Oral, resp. rate 17, height 5\' 5"  (1.651 m), weight 72.802 kg (160 lb 8 oz), SpO2 99 %.  PHYSICAL EXAMINATION:  GENERAL:  82 y.o.-year-old patient lying in the bed with no acute distress.  EYES: Pupils equal, round, reactive to light and accommodation. No scleral icterus. Extraocular muscles intact.  HEENT: Head atraumatic, normocephalic. Oropharynx and nasopharynx clear.  NECK:  Supple, no jugular venous distention. No thyroid enlargement, no tenderness.  LUNGS: Normal breath sounds bilaterally, no wheezing, rales,rhonchi or crepitation. No use of accessory muscles of respiration.  CARDIOVASCULAR: S1, S2 normal. No murmurs, rubs, or gallops.  ABDOMEN: Soft, nontender, nondistended. Bowel sounds present. No organomegaly or mass.  EXTREMITIES: No pedal edema, cyanosis, or clubbing.  NEUROLOGIC: Not able to follow full commands secondary to dementia. PSYCHIATRIC: Demented. SKIN: No obvious rash, lesion, or ulcer.    LABORATORY PANEL:   CBC  Recent Labs Lab 02/05/15 0557  WBC 9.9  HGB 11.2*  HCT 34.3*  PLT 172   ------------------------------------------------------------------------------------------------------------------  Chemistries   Recent Labs Lab 02/01/15 1647 02/02/15 0503  NA 139 140  K 5.4* 4.6  CL 114* 114*  CO2 19* 19*  GLUCOSE 95 84  BUN 40* 40*  CREATININE 2.43* 2.20*   CALCIUM 9.0 8.9  AST 27  --   ALT 16  --   ALKPHOS 77  --   BILITOT 0.4  --    ------------------------------------------------------------------------------------------------------------------  Cardiac Enzymes  Recent Labs Lab 02/02/15 0503  TROPONINI 0.16*   ------------------------------------------------------------------------------------------------------------------  RADIOLOGY:  No results found.  EKG:   Orders placed or performed during the hospital encounter of 02/01/15  . ED EKG  . ED EKG  . EKG 12-Lead  . EKG 12-Lead  . ED EKG  . ED EKG    ASSESSMENT AND PLAN:   1 . Acute encephalopathy with acute cystitis with hematuria: urine cultures negative.Continue Rocephin.,no need for abx at d/c.Marland Kitchen.discontinue IV steroids/ D/c tele, #2 weakness;d/c to SNF.when arrangements are done.  #3 advanced dementia; waiting for placement,.famioy says pt is actually [age] yr old not 89 yrs.  #4.elevated troponin;not a candidate for any work up due to advanced therapy Spoke to family,considering placement,  All the records are reviewed and case discussed with Care Management/Social Workerr. Management plans discussed with the patient, family and they are in agreement.  CODE STATUS: DNR  TOTAL TIME TAKING CARE OF THIS PATIENT: 35minutes.   POSSIBLE D/C IN 1-2 DAYS, DEPENDING ON CLINICAL CONDITION.   Katha HammingKONIDENA,Chamia Schmutz M.D on 02/06/2015 at 2:19 PM  Between 7am to 6pm - Pager - [phone]  After 6pm go to [email or web address] - password [email or web address] [facility]  [facility]  Office  [phone]  CC: Primary care physician; Corky DownsMASOUD, JAVED, MD   Note: This dictation was prepared with Dragon dictation along with smaller phrase technology. Any transcriptional errors that result from this process are unintentional.

## 2015-02-06 NOTE — Discharge Summary (Signed)
Tracy Le, is a 82 y.o. female  DOB 09-21-1913  MRN [id number].  Admission date:  02/01/2015  Admitting Physician  Alford Highlandichard Wieting, MD  Discharge Date:  02/06/2015   Primary MD  Corky DownsMASOUD, JAVED, MD  Recommendations for primary care physician for things to follow:  Follow up with PMD as needed   Admission Diagnosis  UTI (lower urinary tract infection) [N39.0] Elevated troponin [R79.89] Altered mental status, unspecified altered mental status type [R41.82]   Discharge Diagnosis  UTI (lower urinary tract infection) [N39.0] Elevated troponin [R79.89] Altered mental status, unspecified altered mental status type [R41.82]    Active Problems:   Acute encephalopathy   Pressure ulcer      Past Medical History  Diagnosis Date  . Chronic kidney disease     Past Surgical History  Procedure Laterality Date  . Cholecystectomy         History of present illness and  Hospital Course:     Kindly see H&P for history of present illness and admission details, please review complete Labs, Consult reports and Test reports for all details in brief  HPI  from the history and physical done on the day of admission [age]year-old female patient came because of confusion. Admitted for acute encephalopathy, acute cystitis with hematuria.   Hospital Course 1 acute cystitis with hematuria: Urine culture showed Klebsiella, Escherichia coli. Initially patient received Rocephin. Discharging her to nursing home with Cipro 250 mg twice a day for 5 days. #2 patient is not encephalopathy but has baseline dementia and confusion at this time. #3 weakness physical therapy recommends SNF placement. And family agreed for that. #Elevated troponin due to demand ischemia. Patient has no chest pain now, echo showed EF more than 55%. Patient due to her  advanced age does not meet any further requirements of workup. History of Chronic kidney disease stage 4;GFR is 20. Hyperkalemia improved. She'll 5.4 admission improved to 4.6. CODE STATUS DO NOT RESUSCITATE.   Discharge Condition: stable Code 'DNR   Follow UP      Discharge Instructions  and  Discharge Medications        Medication List    TAKE these medications        aspirin 81 MG EC tablet  Take 1 tablet (81 mg total) by mouth daily.     ciprofloxacin 250 MG tablet  Commonly known as:  CIPRO  Take 1 tablet (250 mg total) by mouth 2 (two) times daily.          Diet and Activity recommendation: See Discharge Instructions above   Consults obtained - none   Major procedures and Radiology Reports - PLEASE review detailed and final reports for all details, in brief -     Ct Head Wo Contrast  02/01/2015  CLINICAL DATA:  Weakness and confusion for 2 days EXAM: CT HEAD WITHOUT CONTRAST TECHNIQUE: Contiguous axial images were obtained from the base of the skull through the vertex without intravenous contrast. COMPARISON:  None. FINDINGS: Bony calvarium is intact. There are chronic atrophic changes as well as changes of white matter ischemia and prior right occipital infarct. No findings to suggest acute hemorrhage, acute infarction or space-occupying mass lesion are noted. IMPRESSION: Chronic atrophic changes as well as chronic ischemic change. Electronically Signed   By: Alcide CleverMark  Lukens M.D.   On: 02/01/2015 20:27    Micro Results    Recent Results (from the past 240 hour(s))  Urine culture     Status: None   Collection  Time: 02/01/15  8:38 PM  Result Value Ref Range Status   Specimen Description URINE, CLEAN CATCH  Final   Special Requests Normal  Final   Culture   Final    >=100,000 COLONIES/mL KLEBSIELLA PNEUMONIAE >=100,000 COLONIES/mL ESCHERICHIA COLI    Report Status 02/05/2015 FINAL  Final   Organism ID, Bacteria KLEBSIELLA PNEUMONIAE  Final   Organism  ID, Bacteria ESCHERICHIA COLI  Final      Susceptibility   Escherichia coli - MIC*    AMPICILLIN >=32 RESISTANT Resistant     CEFAZOLIN <=4 SENSITIVE Sensitive     CEFTRIAXONE <=1 SENSITIVE Sensitive     CIPROFLOXACIN <=0.25 SENSITIVE Sensitive     GENTAMICIN >=16 RESISTANT Resistant     IMIPENEM <=0.25 SENSITIVE Sensitive     NITROFURANTOIN <=16 SENSITIVE Sensitive     TRIMETH/SULFA <=20 SENSITIVE Sensitive     PIP/TAZO Value in next row Sensitive      SENSITIVE<=4    * >=100,000 COLONIES/mL ESCHERICHIA COLI   Klebsiella pneumoniae - MIC*    AMPICILLIN Value in next row Resistant      SENSITIVE<=4    CEFAZOLIN Value in next row Sensitive      SENSITIVE<=4    CEFTRIAXONE Value in next row Sensitive      SENSITIVE<=4    CIPROFLOXACIN Value in next row Sensitive      SENSITIVE<=4    GENTAMICIN Value in next row Sensitive      SENSITIVE<=4    IMIPENEM Value in next row Sensitive      SENSITIVE<=4    NITROFURANTOIN Value in next row Sensitive      SENSITIVE<=4    TRIMETH/SULFA Value in next row Sensitive      SENSITIVE<=4    PIP/TAZO Value in next row Sensitive      SENSITIVE<=4    * >=100,000 COLONIES/mL KLEBSIELLA PNEUMONIAE       Today   Subjective:   Matricia Begnaud today has no fever or sob.has baseline dementia.stable to go to SNF.  Objective:   Blood pressure 128/90, pulse 93, temperature 98.2 F (36.8 C), temperature source Oral, resp. rate 17, height 5\' 5"  (1.651 m), weight 72.802 kg (160 lb 8 oz), SpO2 99 %.   Intake/Output Summary (Last 24 hours) at 02/06/15 0918 Last data filed at 02/05/15 1800  Gross per 24 hour  Intake    360 ml  Output      0 ml  Net    360 ml    Exam Confused at times,baseline dementia, No new F.N deficits, Normal affect [place].AT,PERRAL Supple Neck,No JVD, No cervical lymphadenopathy appriciated.  Symmetrical Chest wall movement, Good air movement bilaterally, CTAB RRR,No Gallops,Rubs or new Murmurs, No Parasternal Heave +ve  B.Sounds, Abd Soft, Non tender, No organomegaly appriciated, No rebound -guarding or rigidity. No Cyanosis, Clubbing or edema, No new Rash or bruise  Data Review   CBC w Diff:  Lab Results  Component Value Date   WBC 9.9 02/05/2015   HGB 11.2* 02/05/2015   HCT 34.3* 02/05/2015   PLT 172 02/05/2015   LYMPHOPCT 30 09/08/2014   MONOPCT 9 09/08/2014   EOSPCT 4 09/08/2014   BASOPCT 1 09/08/2014    CMP:  Lab Results  Component Value Date   NA 140 02/02/2015   K 4.6 02/02/2015   CL 114* 02/02/2015   CO2 19* 02/02/2015   BUN 40* 02/02/2015   CREATININE 2.20* 02/02/2015   PROT 7.9 02/01/2015   ALBUMIN 4.0 02/01/2015   BILITOT 0.4  02/01/2015   ALKPHOS 77 02/01/2015   AST 27 02/01/2015   ALT 16 02/01/2015  .   Total Time in preparing paper work, data evaluation and todays exam - 35 minutes  Mesiah Manzo M.D on 02/06/2015 at 9:18 AM    Note: This dictation was prepared with Dragon dictation along with smaller phrase technology. Any transcriptional errors that result from this process are unintentional.

## 2015-02-06 NOTE — Plan of Care (Signed)
Problem: Safety: Goal: Ability to remain free from injury will improve Outcome: Progressing Pt is high falls, bed alarm in use, call bell and phone within reach, hourly safety rounds made.  Pt remains free of injury.  Pt's family at bedside.    Problem: Pain Managment: Goal: General experience of comfort will improve Outcome: Progressing No c/o pain, pt resting comfortably in bed  Problem: Tissue Perfusion: Goal: Risk factors for ineffective tissue perfusion will decrease Outcome: Progressing VSS

## 2015-02-06 NOTE — Progress Notes (Signed)
Pt still has not had any relief with the po ativan.  Per family request spoke with Dr. Anne HahnWillis.  New orders given.  Orson Apeanielle Aleigha Gilani, RN

## 2015-02-06 NOTE — Clinical Social Work Note (Signed)
Pt's daughter in law chose a bed [facility]. CSW confirmed bed for LTC at SNF with Medicaid. [facility]' Admissions Coordinator [place] [facility] [place] [facility]. Coordinator is also following up with pharmacy to find out if medicatiosn could be delivered today due to inclement weather. MD is aware. In the interest of time, CSW sent facility discharge information for potential discharge this afternoon. CSW also updated RN. CSW will continue to follow.   Dede QuerySarah Arieon Corcoran, MSW, LCSW  Clinical Social Worker  [phone]

## 2015-02-06 NOTE — Care Management Important Message (Signed)
Important Message  Patient Details  Name: Alpha GulaLena N Bryant MRN: [id number] Date of Birth: 1913/11/01   Medicare Important Message Given:  Yes    Olegario MessierKathy A Bohden Dung 02/06/2015, 10:07 AM

## 2015-02-06 NOTE — Progress Notes (Signed)
After 2nd dose of ativan pt is resting comfortably. Family at bedside.  Orson Apeanielle Lerae Langham, RN

## 2015-02-07 NOTE — Discharge Summary (Signed)
Tracy Le, is a 82 y.o. female  DOB 1913-10-26  MRN [id number].  Admission date:  02/01/2015  Admitting Physician  Alford Highland, MD  Discharge Date:  02/07/2015   Primary MD  Corky Downs, MD  Recommendations for primary care physician for things to follow:  Follow up with PMD as needed Discharge to [facility] today  Admission Diagnosis  UTI (lower urinary tract infection) [N39.0] Elevated troponin [R79.89] Altered mental status, unspecified altered mental status type [R41.82]   Discharge Diagnosis  UTI (lower urinary tract infection) [N39.0] Elevated troponin [R79.89] Altered mental status, unspecified altered mental status type [R41.82]    Active Problems:   Acute encephalopathy   Pressure ulcer      Past Medical History  Diagnosis Date  . Chronic kidney disease     Past Surgical History  Procedure Laterality Date  . Cholecystectomy         History of present illness and  Hospital Course:     Kindly see H&P for history of present illness and admission details, please review complete Labs, Consult reports and Test reports for all details in brief  HPI  from the history and physical done on the day of admission 82year-old female patient came because of confusion. Admitted for acute encephalopathy, acute cystitis with hematuria.   Hospital Course 1 acute cystitis with hematuria: Urine culture showed Klebsiella, Escherichia coli. Initially patient received Rocephin. Discharging her to nursing home with Cipro 250 mg twice a day for 5 days. #2 patient is not encephalopathy but has baseline dementia and confusion at this time. #3 weakness physical therapy recommends SNF placement. And family agreed for that. #Elevated troponin due to demand ischemia. Patient has no chest pain now, echo showed EF more  than 55%. Patient due to her advanced age does not meet any further requirements of workup. History of Chronic kidney disease stage 4;GFR is 20. Hyperkalemia improved. She'll 5.4 admission improved to 4.6. CODE STATUS DO NOT RESUSCITATE.   Discharge Condition: stable Code 'DNR   Follow UP      Discharge Instructions  and  Discharge Medications        Medication List    TAKE these medications        aspirin 81 MG EC tablet  Take 1 tablet (81 mg total) by mouth daily.     ciprofloxacin 250 MG tablet  Commonly known as:  CIPRO  Take 1 tablet (250 mg total) by mouth 2 (two) times daily.          Diet and Activity recommendation: See Discharge Instructions above   Consults obtained - none   Major procedures and Radiology Reports - PLEASE review detailed and final reports for all details, in brief -     Ct Head Wo Contrast  02/01/2015  CLINICAL DATA:  Weakness and confusion for 2 days EXAM: CT HEAD WITHOUT CONTRAST TECHNIQUE: Contiguous axial images were obtained from the base of the skull through the vertex without intravenous contrast. COMPARISON:  None. FINDINGS: Bony calvarium is intact. There are chronic atrophic changes as well as changes of white matter ischemia and prior right occipital infarct. No findings to suggest acute hemorrhage, acute infarction or space-occupying mass lesion are noted. IMPRESSION: Chronic atrophic changes as well as chronic ischemic change. Electronically Signed   By: Alcide Clever M.D.   On: 02/01/2015 20:27    Micro Results    Recent Results (from the past 240 hour(s))  Urine culture     Status:  None   Collection Time: 02/01/15  8:38 PM  Result Value Ref Range Status   Specimen Description URINE, CLEAN CATCH  Final   Special Requests Normal  Final   Culture   Final    >=100,000 COLONIES/mL KLEBSIELLA PNEUMONIAE >=100,000 COLONIES/mL ESCHERICHIA COLI    Report Status 02/05/2015 FINAL  Final   Organism ID, Bacteria KLEBSIELLA  PNEUMONIAE  Final   Organism ID, Bacteria ESCHERICHIA COLI  Final      Susceptibility   Escherichia coli - MIC*    AMPICILLIN >=32 RESISTANT Resistant     CEFAZOLIN <=4 SENSITIVE Sensitive     CEFTRIAXONE <=1 SENSITIVE Sensitive     CIPROFLOXACIN <=0.25 SENSITIVE Sensitive     GENTAMICIN >=16 RESISTANT Resistant     IMIPENEM <=0.25 SENSITIVE Sensitive     NITROFURANTOIN <=16 SENSITIVE Sensitive     TRIMETH/SULFA <=20 SENSITIVE Sensitive     PIP/TAZO Value in next row Sensitive      SENSITIVE<=4    * >=100,000 COLONIES/mL ESCHERICHIA COLI   Klebsiella pneumoniae - MIC*    AMPICILLIN Value in next row Resistant      SENSITIVE<=4    CEFAZOLIN Value in next row Sensitive      SENSITIVE<=4    CEFTRIAXONE Value in next row Sensitive      SENSITIVE<=4    CIPROFLOXACIN Value in next row Sensitive      SENSITIVE<=4    GENTAMICIN Value in next row Sensitive      SENSITIVE<=4    IMIPENEM Value in next row Sensitive      SENSITIVE<=4    NITROFURANTOIN Value in next row Sensitive      SENSITIVE<=4    TRIMETH/SULFA Value in next row Sensitive      SENSITIVE<=4    PIP/TAZO Value in next row Sensitive      SENSITIVE<=4    * >=100,000 COLONIES/mL KLEBSIELLA PNEUMONIAE       Today   Subjective:   Tracy Le today has no fever or sob.has baseline dementia.stable to go to SNF.  Objective:   Blood pressure 127/92, pulse 75, temperature 97.6 F (36.4 C), temperature source Oral, resp. rate 16, height 5\' 5"  (1.651 m), weight 72.802 kg (160 lb 8 oz), SpO2 99 %.   Intake/Output Summary (Last 24 hours) at 02/07/15 1345 Last data filed at 02/07/15 0921  Gross per 24 hour  Intake    240 ml  Output      0 ml  Net    240 ml    Exam Confused at times,baseline dementia, No new F.N deficits, Normal affect [place].AT,PERRAL Supple Neck,No JVD, No cervical lymphadenopathy appriciated.  Symmetrical Chest wall movement, Good air movement bilaterally, CTAB RRR,No Gallops,Rubs or new Murmurs,  No Parasternal Heave +ve B.Sounds, Abd Soft, Non tender, No organomegaly appriciated, No rebound -guarding or rigidity. No Cyanosis, Clubbing or edema, No new Rash or bruise  Data Review   CBC w Diff:  Lab Results  Component Value Date   WBC 9.9 02/05/2015   HGB 11.2* 02/05/2015   HCT 34.3* 02/05/2015   PLT 172 02/05/2015   LYMPHOPCT 30 09/08/2014   MONOPCT 9 09/08/2014   EOSPCT 4 09/08/2014   BASOPCT 1 09/08/2014    CMP:  Lab Results  Component Value Date   NA 140 02/02/2015   K 4.6 02/02/2015   CL 114* 02/02/2015   CO2 19* 02/02/2015   BUN 40* 02/02/2015   CREATININE 2.20* 02/02/2015   PROT 7.9 02/01/2015   ALBUMIN 4.0 02/01/2015  BILITOT 0.4 02/01/2015   ALKPHOS 77 02/01/2015   AST 27 02/01/2015   ALT 16 02/01/2015  .   Total Time in preparing paper work, data evaluation and todays exam - 35 minutes  Galadriel Shroff M.D on 02/07/2015 at 1:45 PM    Note: This dictation was prepared with Dragon dictation along with smaller phrase technology. Any transcriptional errors that result from this process are unintentional.

## 2015-02-07 NOTE — Progress Notes (Signed)
[facility]    MR#:  [id number]  DATE OF BIRTH:  1914-01-05  SUBJECTIVE: Patient admitted for confusion, found to have dementia and  UTI. Stable for discharge to nursing home  But Pending DSS  Approval.  CHIEF COMPLAINT:   Chief Complaint  Patient presents with  . Weakness  . Altered Mental Status    REVIEW OF SYSTEMS:   Review of Systems  Unable to perform ROS: dementia     DRUG ALLERGIES:  No Known Allergies  VITALS:  Blood pressure 127/92, pulse 75, temperature 97.6 F (36.4 C), temperature source Oral, resp. rate 16, height 5\' 5"  (1.651 m), weight 72.802 kg (160 lb 8 oz), SpO2 99 %.  PHYSICAL EXAMINATION:  GENERAL:  82 y.o.-year-old patient lying in the bed with no acute distress.  EYES: Pupils equal, round, reactive to light and accommodation. No scleral icterus. Extraocular muscles intact.  HEENT: Head atraumatic, normocephalic. Oropharynx and nasopharynx clear.  NECK:  Supple, no jugular venous distention. No thyroid enlargement, no tenderness.  LUNGS: Normal breath sounds bilaterally, no wheezing, rales,rhonchi or crepitation. No use of accessory muscles of respiration.  CARDIOVASCULAR: S1, S2 normal. No murmurs, rubs, or gallops.  ABDOMEN: Soft, nontender, nondistended. Bowel sounds present. No organomegaly or mass.  EXTREMITIES: No pedal edema, cyanosis, or clubbing.  NEUROLOGIC: Not able to follow full commands secondary to dementia. PSYCHIATRIC: Demented. SKIN: No obvious rash, lesion, or ulcer.    LABORATORY PANEL:   CBC  Recent Labs Lab 02/05/15 0557  WBC 9.9  HGB 11.2*  HCT 34.3*  PLT 172   ------------------------------------------------------------------------------------------------------------------  Chemistries   Recent Labs Lab 02/01/15 1647 02/02/15 0503  NA 139 140  K 5.4* 4.6  CL 114* 114*  CO2 19* 19*  GLUCOSE 95 84  BUN 40* 40*  CREATININE 2.43* 2.20*   CALCIUM 9.0 8.9  AST 27  --   ALT 16  --   ALKPHOS 77  --   BILITOT 0.4  --    ------------------------------------------------------------------------------------------------------------------  Cardiac Enzymes  Recent Labs Lab 02/02/15 0503  TROPONINI 0.16*   ------------------------------------------------------------------------------------------------------------------  RADIOLOGY:  No results found.  EKG:   Orders placed or performed during the hospital encounter of 02/01/15  . ED EKG  . ED EKG  . EKG 12-Lead  . EKG 12-Lead  . ED EKG  . ED EKG    ASSESSMENT AND PLAN:   1 . Acute encephalopathy with acute cystitis with hematuria: Urine culture showed Klebsiella,Escherichia coli. On cipro.  250 mg bid for  5 days ,stable for discharge, #2 weakness;d/c to SNF.when arrangements are done.  #3 advanced dementia; waiting for placement,.famioy says pt is actually [age] yr old not 89 yrs.  #4.elevated troponin;not a candidate for any work up due to advanced therapy Spoke to family,considering placement,  All the records are reviewed and case discussed with Care Management/Social Workerr. Management plans discussed with the patient, family and they are in agreement.  CODE STATUS: DNR  TOTAL TIME TAKING CARE OF THIS PATIENT: 35minutes.   POSSIBLE D/C IN 1-2 DAYS, DEPENDING ON CLINICAL CONDITION.   Katha HammingKONIDENA,Adalbert Alberto M.D on 02/07/2015 at 9:22 AM  Between 7am to 6pm - Pager - [phone]  After 6pm go to [email or web address] - password [email or web address] [facility]  [facility]  Office  [phone]  CC: Primary care physician; Corky DownsMASOUD, JAVED, MD   Note: This dictation was prepared with Dragon dictation along with smaller phrase technology. Any transcriptional errors that result  from this process are unintentional.

## 2015-02-07 NOTE — Plan of Care (Signed)
Pt will be d/ced to [facility].  She is alert to self only - no longer incoherent or aggressive - but is either [facility] or just doesn't understand what we're saying.  Hx of dementia.   Not experiencing any pain.  Is incontinent of B&B - no hematuria.

## 2015-02-07 NOTE — Clinical Social Work Placement (Signed)
   CLINICAL SOCIAL WORK PLACEMENT  NOTE  Date:  02/07/2015  Patient Details  Name: Tracy Le MRN: [id number] Date of Birth: 1913/04/25  Clinical Social Work is seeking post-discharge placement for this patient at the Skilled  Nursing Facility level of care (*CSW will initial, date and re-position this form in  chart as items are completed):  Yes   Patient/family provided with [facility] Clinical Social Work Department's list of facilities offering this level of care within the geographic area requested by the patient (or if unable, by the patient's family).  Yes   Patient/family informed of their freedom to choose among providers that offer the needed level of care, that participate in Medicare, Medicaid or managed care program needed by the patient, have an available bed and are willing to accept the patient.  Yes   Patient/family informed of [facility]'s ownership interest in [facility] and [facility], as well as of the fact that they are under no obligation to receive care at these facilities.  PASRR submitted to EDS on 02/03/15     PASRR number received on 02/03/15     Existing PASRR number confirmed on       FL2 transmitted to all facilities in geographic area requested by pt/family on 02/03/15     FL2 transmitted to all facilities within larger geographic area on       Patient informed that his/her managed care company has contracts with or will negotiate with certain facilities, including the following:        Yes   Patient/family informed of bed offers received.  [facility] [place]     Physician recommends and patient chooses bed at  [facility](SNF)    Patient to be [facility] to [facility] [place] on 02/07/15.  [facility]     Patient family notified on 02/07/15 of transfer.  Name of family member notified:  Steward DroneBrenda, daughter in law     PHYSICIAN       Additional Comment:     _______________________________________________ Dede QuerySarah Helena Sardo, LCSW 02/07/2015, 4:24 PM

## 2015-02-07 NOTE — Clinical Social Work Note (Signed)
Pt is ready for discharge today to [facility]. Pt's family is in agreement with discharge plan. Facility has received discharge information and is ready to admit pt. RN to call report and EMS will transportation. CSW is signing off as no further needs identified.   Dede QuerySarah Lynwood Kubisiak, MSW, LCSW Clinical Social Worker  [phone]

## 2015-02-07 NOTE — Plan of Care (Signed)
Report called to Greig Castillandrew at [facility] [phone].  She's going to [address].  EMS called.  IV removed and pt placed in transport packet.

## 2015-02-07 NOTE — Plan of Care (Signed)
Problem: Education: Goal: Knowledge of [facility] Education information/materials will improve Outcome: Progressing Family oriented to unit  Problem: Safety: Goal: Ability to remain free from injury will improve Outcome: Progressing High Fall risk and use of bed alarm for safety  Problem: Pain Managment: Goal: General experience of comfort will improve Outcome: Progressing Attempt to turn pt and use pillows to support heels, arms.  Pt prefers supine position and difficult to get side to side.  Problem: Physical Regulation: Goal: Ability to maintain clinical measurements within normal limits will improve Outcome: Progressing VSS, afebrile  Problem: Skin Integrity: Goal: Risk for impaired skin integrity will decrease Outcome: Progressing Foam in place over sacrum, using skin cleansing spray and barrier cream with each diaper change. Pt kept dry and clean.  Problem: Fluid Volume: Goal: Ability to maintain a balanced intake and output will improve Outcome: Progressing Pt able to swallow and take food and fluids with assistance with no problem  Problem: Urinary Elimination: Goal: Signs and symptoms of infection will decrease Outcome: Progressing Afebrile

## 2015-02-17 ENCOUNTER — Encounter: Payer: Self-pay | Admitting: Internal Medicine

## 2015-06-29 DEATH — deceased
